# Patient Record
Sex: Female | Born: 2000 | Race: Black or African American | Hispanic: No | Marital: Single | Smoking: Never smoker
Health system: Southern US, Community
[De-identification: ages and names within clinical notes are randomized; demographics above are authoritative.]

## PROBLEM LIST (undated history)

## (undated) DIAGNOSIS — R519 Headache, unspecified: Secondary | ICD-10-CM

## (undated) DIAGNOSIS — R51 Headache: Secondary | ICD-10-CM

## (undated) DIAGNOSIS — Z8619 Personal history of other infectious and parasitic diseases: Secondary | ICD-10-CM

## (undated) HISTORY — DX: Personal history of other infectious and parasitic diseases: Z86.19

---
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
## 2018-01-01 ENCOUNTER — Encounter: Payer: Self-pay | Admitting: Obstetrics and Gynecology

## 2018-01-01 ENCOUNTER — Ambulatory Visit (INDEPENDENT_AMBULATORY_CARE_PROVIDER_SITE_OTHER): Payer: BLUE CROSS/BLUE SHIELD | Admitting: Obstetrics and Gynecology

## 2018-01-01 VITALS — BP 111/68 | HR 64 | Resp 16 | Ht 67.0 in | Wt 131.0 lb

## 2018-01-01 DIAGNOSIS — R102 Pelvic and perineal pain: Secondary | ICD-10-CM

## 2018-01-01 DIAGNOSIS — N912 Amenorrhea, unspecified: Secondary | ICD-10-CM

## 2018-01-01 DIAGNOSIS — Z113 Encounter for screening for infections with a predominantly sexual mode of transmission: Secondary | ICD-10-CM | POA: Diagnosis not present

## 2018-01-01 DIAGNOSIS — Z3201 Encounter for pregnancy test, result positive: Secondary | ICD-10-CM

## 2018-01-01 LAB — POCT URINE PREGNANCY: PREG TEST UR: NEGATIVE

## 2018-01-01 NOTE — Progress Notes (Signed)
GYNECOLOGY OFFICE VISIT NOTE  History:  17 y.o. G0P0000 here today for abdominal pain for about 6 weeks. She reports cramping in lower abdomen last month, left side worse than right but still hurts on both sides. Intermittent pain, does not associate it with anything. Sometimes has poor appetite due to pain. Reports it can be 10/10 but is not that painful all of the time. Has not been to the hospital for this pain. Uses condoms but not 100% of the time. Reports vaginal discharge similar to what she has had in the past but heavier. Reports yeast infection 11/2017 that she self treated that improved.   She does report positive home pregnancy test about 2 weeks ago. Reports she has some nausea, return of migraines. She is very concerned that she is pregnant. Stopped OCPs 06/2017 because it was making her sick.   Past Medical History:  Diagnosis Date  . Frequent headaches    History reviewed. No pertinent surgical history.   Current Outpatient Medications:  .  aspirin-acetaminophen-caffeine (EXCEDRIN MIGRAINE) 250-250-65 MG tablet, Take 1 tablet by mouth every 6 (six) hours as needed for headache (1-2 tabs)., Disp: , Rfl:  .  SUMAtriptan (IMITREX) 25 MG tablet, Take 1 tablet (25 mg total) by mouth every 2 (two) hours as needed for migraine. Maximum 2 times a week, Disp: 10 tablet, Rfl: 1 .  medroxyPROGESTERone (PROVERA) 10 MG tablet, Take 1 tablet (10 mg total) by mouth daily. Use for ten days (Patient not taking: Reported on 01/01/2018), Disp: 10 tablet, Rfl: 2 .  norgestrel-ethinyl estradiol (LO/OVRAL,CRYSELLE) 0.3-30 MG-MCG tablet, Take 1 tablet by mouth daily. (Patient not taking: Reported on 07/12/2017), Disp: 1 Package, Rfl: 11 .  prochlorperazine (COMPAZINE) 10 MG tablet, Take 1 tablet (10 mg total) by mouth every 8 (eight) hours as needed for nausea or vomiting (Nausea or at onset of migraine). (Patient not taking: Reported on 11/29/2016), Disp: 6 tablet, Rfl: 0  The following portions of  the patient's history were reviewed and updated as appropriate: allergies, current medications, past family history, past medical history, past social history, past surgical history and problem list.   Health Maintenance:  Gardasil: has received Flu: has received  Review of Systems:  Pertinent items noted in HPI and remainder of comprehensive ROS otherwise negative.   Objective:  Physical Exam BP 111/68   Pulse 64   Resp 16   Ht 5\' 7"  (1.702 m)   Wt 131 lb (59.4 kg)   LMP 11/13/2017   BMI 20.52 kg/m  CONSTITUTIONAL: Well-developed, well-nourished female in no acute distress.  HENT:  Normocephalic, atraumatic. External right and left ear normal. Oropharynx is clear and moist EYES: Conjunctivae and EOM are normal. Pupils are equal, round, and reactive to light. No scleral icterus.  NECK: Normal range of motion, supple, no masses SKIN: Skin is warm and dry. No rash noted. Not diaphoretic. No erythema. No pallor. NEUROLOGIC: Alert and oriented to person, place, and time. Normal reflexes, muscle tone coordination. No cranial nerve deficit noted. PSYCHIATRIC: Normal mood and affect. Normal behavior. Normal judgment and thought content. CARDIOVASCULAR: Normal heart rate noted RESPIRATORY: Effort normal, no problems with respiration noted ABDOMEN: Soft, no distention noted.  Mild tenderness over suprapubic area and lower quadrants PELVIC: normal appearing external female genitalia with some clear discharge, no uterine tenderness, mild bilateral adenxal tenderness but no fullness or masses palpable MUSCULOSKELETAL: Normal range of motion. No edema noted.  Labs and Imaging No results found.  Assessment & Plan:  1.  Amenorrhea - POCT urine pregnancy - reviewed importance of contraception and use of condoms Declines hormonal contraception for right now  2. Positive pregnancy test -positive at home, neg in office, will repeat HCG - B-HCG Quant  3. Pelvic pain - reviewed concern for  infectious etiology of pain vs resolving pregnancy vs adnexal masses not palpable given BL adnexal pain Reviewed importance of ruling out infectious etiology Will send for [place] STI testing today - Urine cytology ancillary only - Cervicovaginal ancillary only - US Pelvis Complete; Future - US Transvaginal Non-OB; Future - HIV antibody (with reflex) - RPR - Hepatitis B surface antigen - Hepatitis C antibody   Routine preventative health maintenance measures emphasized. Please refer to After Visit Summary for other counseling recommendations.   Return if symptoms worsen or fail to improve.   Baldemar Lenis, M.D. Attending Obstetrician & Gynecologist, [facility] for [employer], [facility]

## 2018-01-02 LAB — RPR: RPR: NONREACTIVE

## 2018-01-02 LAB — HEPATITIS C ANTIBODY
Hepatitis C Ab: NONREACTIVE
SIGNAL TO CUT-OFF: 0.04 (ref <1.00)

## 2018-01-02 LAB — URINE CYTOLOGY ANCILLARY ONLY
CHLAMYDIA, DNA PROBE: NEGATIVE
Neisseria Gonorrhea: NEGATIVE

## 2018-01-02 LAB — HEPATITIS B SURFACE ANTIGEN: Hepatitis B Surface Ag: NONREACTIVE

## 2018-01-02 LAB — HIV ANTIBODY (ROUTINE TESTING W REFLEX): HIV 1&2 Ab, 4th Generation: NONREACTIVE

## 2018-01-02 LAB — HCG, QUANTITATIVE, PREGNANCY: HCG, Total, QN: <2 m[IU]/mL

## 2018-01-02 LAB — CERVICOVAGINAL ANCILLARY ONLY
BACTERIAL VAGINITIS: NEGATIVE
Candida vaginitis: NEGATIVE
Trichomonas: NEGATIVE

## 2018-01-04 ENCOUNTER — Ambulatory Visit (INDEPENDENT_AMBULATORY_CARE_PROVIDER_SITE_OTHER): Payer: BLUE CROSS/BLUE SHIELD

## 2018-01-04 ENCOUNTER — Encounter: Payer: Self-pay | Admitting: *Deleted

## 2018-01-04 DIAGNOSIS — R102 Pelvic and perineal pain: Secondary | ICD-10-CM | POA: Diagnosis not present

## 2018-01-08 ENCOUNTER — Telehealth: Payer: Self-pay

## 2018-01-08 NOTE — Telephone Encounter (Signed)
I spoke with pt and she is aware that her STI testing was negative and that her ultrasound was normal. Pt states that the abdominal pain has gotten worse. I sent a message to Dr.Davis to see what she would like us to do from here and I told pt when I hear back from Dr.Davis I will give her a call.

## 2018-01-17 ENCOUNTER — Ambulatory Visit (HOSPITAL_COMMUNITY)
Admission: RE | Admit: 2018-01-17 | Discharge: 2018-01-17 | Disposition: A | Discharge status: Home / Self Care | Payer: BLUE CROSS/BLUE SHIELD | Source: Ambulatory Visit | Attending: Pediatrics | Admitting: Pediatrics

## 2018-01-17 ENCOUNTER — Other Ambulatory Visit: Payer: Self-pay | Admitting: Pediatrics

## 2018-01-17 DIAGNOSIS — R1084 Generalized abdominal pain: Secondary | ICD-10-CM | POA: Diagnosis not present

## 2018-01-19 ENCOUNTER — Other Ambulatory Visit (HOSPITAL_COMMUNITY): Payer: Self-pay | Admitting: Pediatrics

## 2018-01-19 DIAGNOSIS — R1084 Generalized abdominal pain: Secondary | ICD-10-CM

## 2018-06-13 ENCOUNTER — Other Ambulatory Visit (INDEPENDENT_AMBULATORY_CARE_PROVIDER_SITE_OTHER): Payer: BLUE CROSS/BLUE SHIELD

## 2018-06-13 DIAGNOSIS — Z3202 Encounter for pregnancy test, result negative: Secondary | ICD-10-CM | POA: Diagnosis not present

## 2018-06-13 DIAGNOSIS — Z3200 Encounter for pregnancy test, result unknown: Secondary | ICD-10-CM

## 2018-06-13 DIAGNOSIS — N898 Other specified noninflammatory disorders of vagina: Secondary | ICD-10-CM | POA: Diagnosis not present

## 2018-06-13 LAB — POCT URINE PREGNANCY: Preg Test, Ur: NEGATIVE

## 2018-06-13 NOTE — Addendum Note (Signed)
Addended by: Kathie DikeSOLA, Foy Mungia J on: 06/13/2018 01:26 PM   Modules accepted: Orders

## 2018-06-13 NOTE — Progress Notes (Signed)
PT came in for a pregnancy test. Test was negative. PT also complaining about vaginal irritation and discharge. Aptima sent.

## 2018-06-14 LAB — CERVICOVAGINAL ANCILLARY ONLY
Bacterial vaginitis: NEGATIVE
CANDIDA VAGINITIS: POSITIVE — AB

## 2018-06-15 ENCOUNTER — Other Ambulatory Visit: Payer: Self-pay | Admitting: Obstetrics & Gynecology

## 2018-06-15 MED ORDER — FLUCONAZOLE 150 MG PO TABS: 150.0000 mg | ORAL_TABLET | Freq: Once | ORAL | 3 refills | Status: AC

## 2018-06-15 NOTE — Progress Notes (Signed)
Diflucan prescribed

## 2018-06-18 ENCOUNTER — Telehealth: Payer: Self-pay

## 2018-06-18 DIAGNOSIS — B379 Candidiasis, unspecified: Secondary | ICD-10-CM

## 2018-06-18 MED ORDER — FLUCONAZOLE 150 MG PO TABS: 150.0000 mg | ORAL_TABLET | Freq: Once | ORAL | 0 refills | Status: AC

## 2018-06-18 NOTE — Telephone Encounter (Signed)
Spoke with pt and she is aware of positive yeast infection. Dr.Dove had sent Diflucan in for her but, it was to the wrong pharmacy so I am sending in new Rx to the correct pharmacy. Pt is aware.

## 2018-07-23 DIAGNOSIS — A084 Viral intestinal infection, unspecified: Secondary | ICD-10-CM | POA: Diagnosis not present

## 2018-08-22 ENCOUNTER — Ambulatory Visit (INDEPENDENT_AMBULATORY_CARE_PROVIDER_SITE_OTHER): Payer: BLUE CROSS/BLUE SHIELD | Admitting: Obstetrics & Gynecology

## 2018-08-22 ENCOUNTER — Encounter: Payer: Self-pay | Admitting: Obstetrics & Gynecology

## 2018-08-22 VITALS — BP 83/51 | HR 61 | Resp 16 | Ht 67.0 in | Wt 130.0 lb

## 2018-08-22 DIAGNOSIS — Z3009 Encounter for other general counseling and advice on contraception: Secondary | ICD-10-CM

## 2018-08-22 DIAGNOSIS — Z113 Encounter for screening for infections with a predominantly sexual mode of transmission: Secondary | ICD-10-CM

## 2018-08-22 DIAGNOSIS — N898 Other specified noninflammatory disorders of vagina: Secondary | ICD-10-CM

## 2018-08-23 LAB — CERVICOVAGINAL ANCILLARY ONLY
BACTERIAL VAGINITIS: NEGATIVE
CHLAMYDIA, DNA PROBE: POSITIVE — AB
Candida vaginitis: NEGATIVE
NEISSERIA GONORRHEA: NEGATIVE
Trichomonas: NEGATIVE

## 2018-08-23 LAB — HEPATITIS B SURFACE ANTIGEN: Hepatitis B Surface Ag: NONREACTIVE

## 2018-08-23 LAB — RPR: RPR Ser Ql: NONREACTIVE

## 2018-08-23 LAB — HEPATITIS C ANTIBODY
Hepatitis C Ab: NONREACTIVE
SIGNAL TO CUT-OFF: 0.05 (ref <1.00)

## 2018-08-23 LAB — HIV ANTIBODY (ROUTINE TESTING W REFLEX): HIV 1&2 Ab, 4th Generation: NONREACTIVE

## 2018-08-24 ENCOUNTER — Encounter: Payer: Self-pay | Admitting: Obstetrics & Gynecology

## 2018-08-24 ENCOUNTER — Telehealth: Payer: Self-pay

## 2018-08-24 DIAGNOSIS — A749 Chlamydial infection, unspecified: Secondary | ICD-10-CM | POA: Insufficient documentation

## 2018-08-24 MED ORDER — AZITHROMYCIN 500 MG PO TABS: 1000.0000 mg | ORAL_TABLET | Freq: Once | ORAL | 0 refills | Status: AC

## 2018-08-24 MED ORDER — AZITHROMYCIN 250 MG PO TABS: 250.0000 mg | ORAL_TABLET | Freq: Once | ORAL | 0 refills | Status: DC

## 2018-08-24 NOTE — Telephone Encounter (Signed)
Spoke with pt and let her know of positive Chlamydia results. I explained to pt that her partner also needs to be seen and treated. Pt expressed understanding. Pt is aware she will need to come back for TOC. She has an appt on 10/14 with Dr.Leggett. I explained [facility] but she can speak with doctor about it. Pharmacy verified and Zithromax 1 gram PO 1 dose per protocol book.

## 2018-08-25 NOTE — Progress Notes (Signed)
   Subjective:    Patient ID: Lori Simpson, female    DOB: 05/23/2001, 17 y.o.   MRN: [id number]  HPI  Patient presents c/o very thick discharge at times.  She is not having it today.  Pt brought a picture of her discharge.  It is white and thick.  Pt has one sexual partner.  They may not be exclusive.  Patient also has a lot of questions about the physiology of her reproductive organs, birth control, and STDs.  Review of Systems  Constitutional: Negative.   Respiratory: Negative.   Cardiovascular: Negative.   Gastrointestinal: Negative.   Genitourinary: Positive for vaginal discharge. Negative for pelvic pain.  Psychiatric/Behavioral: Negative.        Objective:   Physical Exam  Constitutional: She is oriented to person, place, and time. She appears well-developed and well-nourished. No distress.  HENT:  Head: Normocephalic and atraumatic.  Eyes: Conjunctivae are normal.  Cardiovascular: Normal rate.  Pulmonary/Chest: Effort normal.  Abdominal: Soft. Bowel sounds are normal. She exhibits no distension and no mass. There is no tenderness. There is no rebound and no guarding.  Genitourinary:  Genitourinary Comments: No white discharge in vault Cervix closed and NT   Musculoskeletal: She exhibits no edema.  Neurological: She is alert and oriented to person, place, and time.  Skin: Skin is warm and dry.  Psychiatric: She has a normal mood and affect.  Vitals reviewed.   Vitals:   08/22/18 1032  BP: (!) 83/51  Pulse: 61  Resp: 16  Weight: 130 lb (59 kg)  Height: 5\' 7"  (1.702 m)      Assessment & Plan:  17 yo female presents with vaginal discharge  1.  Screening for vaginitis and all STDS 2.  Counseling about birth control, family planning, and prevention of STDs .  RTC in 2-3 weeks for 30 more minutes of counseling.    25 minutes face to face with patient with greater than 50% counseling  +Chlamydai--Rx with zithromax and RN to offer expedited partner treatment if  she can verify his allergies.

## 2018-08-27 ENCOUNTER — Telehealth: Payer: Self-pay

## 2018-08-27 NOTE — Telephone Encounter (Signed)
Lori Simpson called to say that she checked with pharmacy and a prescription has not been sent in. Please check on and advise

## 2018-08-27 NOTE — Telephone Encounter (Signed)
Pt called stating that her pharmacy didn't receive the RX for Azithromycin that Dr.Leggett put in. I called pharmacy and fixed it. Pt notified that Rx has been fixed.

## 2018-09-03 ENCOUNTER — Ambulatory Visit: Payer: BLUE CROSS/BLUE SHIELD | Admitting: Obstetrics & Gynecology

## 2018-09-17 ENCOUNTER — Ambulatory Visit (INDEPENDENT_AMBULATORY_CARE_PROVIDER_SITE_OTHER): Payer: BLUE CROSS/BLUE SHIELD | Admitting: Obstetrics & Gynecology

## 2018-09-17 ENCOUNTER — Encounter: Payer: Self-pay | Admitting: Obstetrics & Gynecology

## 2018-09-17 VITALS — BP 108/69 | HR 66 | Ht 67.0 in | Wt 127.0 lb

## 2018-09-17 DIAGNOSIS — Z202 Contact with and (suspected) exposure to infections with a predominantly sexual mode of transmission: Secondary | ICD-10-CM

## 2018-09-17 DIAGNOSIS — Z30011 Encounter for initial prescription of contraceptive pills: Secondary | ICD-10-CM

## 2018-09-17 MED ORDER — NORETHIN ACE-ETH ESTRAD-FE 1-20 MG-MCG(24) PO TABS: 1.0000 | ORAL_TABLET | Freq: Every day | ORAL | 11 refills | Status: DC

## 2018-09-17 NOTE — Progress Notes (Signed)
   Subjective:    Patient ID: Lori Simpson, female    DOB: April 02, 2001, 17 y.o.   MRN: [id number]  HPI Pt presents to ask more questions about anatomy and to discuss birth control.  Pt took the zithromax and no longer has d/c.  Pt told partner.  She has new partner and used condoms.  Pt needs TOC but would prefer for it to occur when she is not on her menses.  Pt interested in OCPs to help her menstrual cycle be more regular and hair growth as well as prevent pregnancy.  Pt also told us that her mother is pregnant (4th child) and comes to our practice.  Review of Systems  Constitutional: Negative.        Hari growth on neck and breasts  Gastrointestinal: Negative.   Genitourinary: Positive for menstrual problem. Negative for vaginal discharge and vaginal pain.  Psychiatric/Behavioral: Negative.        Objective:   Physical Exam  Constitutional: She is oriented to person, place, and time. She appears well-developed and well-nourished. No distress.  HENT:  Head: Normocephalic and atraumatic.  Eyes: Conjunctivae are normal.  Cardiovascular: Normal rate.  Pulmonary/Chest: Effort normal.  Musculoskeletal: She exhibits no edema.  Neurological: She is alert and oriented to person, place, and time.  Skin: Skin is warm and dry.  Mild acne, Hir follicle inflamed under chin from plucking.  Psychiatric: She has a normal mood and affect.  Vitals reviewed.  Vitals:   09/17/18 1308  BP: 108/69  Pulse: 66  Weight: 127 lb (57.6 kg)  Height: 5\' 7"  (1.702 m)    Assessment & Plan:  17 yo female with irregular menstrual cycle and mild hirsutism.  Had a recent positive test for chlamydia and underwent treatment.  1.  On menses and would like to have test of cure next week.  Karie Fetch, RN, will conduct test next week 2.  Would like to start combination OCPs for pregnancy prevention, cycle control and hirsutism. 3.  Discussed reproductive anatomy with diagrams. 4.  Condoms for STD prevention  every time. 5.  Return to see me in 2 months post OCP start.  30 minutes spent face-to-face with patient with greater than 50% counseling.

## 2018-09-24 ENCOUNTER — Ambulatory Visit: Payer: BLUE CROSS/BLUE SHIELD

## 2018-11-19 ENCOUNTER — Ambulatory Visit (INDEPENDENT_AMBULATORY_CARE_PROVIDER_SITE_OTHER): Payer: BLUE CROSS/BLUE SHIELD | Admitting: Obstetrics & Gynecology

## 2018-11-19 ENCOUNTER — Encounter: Payer: Self-pay | Admitting: Obstetrics & Gynecology

## 2018-11-19 VITALS — BP 98/65 | HR 67 | Ht 67.0 in | Wt 136.0 lb

## 2018-11-19 DIAGNOSIS — Z8619 Personal history of other infectious and parasitic diseases: Secondary | ICD-10-CM | POA: Diagnosis not present

## 2018-11-19 DIAGNOSIS — Z789 Other specified health status: Secondary | ICD-10-CM

## 2018-11-19 DIAGNOSIS — Z113 Encounter for screening for infections with a predominantly sexual mode of transmission: Secondary | ICD-10-CM

## 2018-11-19 DIAGNOSIS — Z3009 Encounter for other general counseling and advice on contraception: Secondary | ICD-10-CM | POA: Diagnosis not present

## 2018-11-19 NOTE — Progress Notes (Signed)
   Subjective:    Patient ID: Lori Simpson, female    DOB: 05/23/2001, 17 y.o.   MRN: [id number]  HPI  Pt presents today for f/u of birth control  .  Pt stopped b/c of stomach pain even when taking it at night.  Pt not having sex right now and not interested in birth control.  Pt agrees to use condoms.    Review of Systems  Constitutional: Negative.   Respiratory: Negative.   Cardiovascular: Negative.   Genitourinary: Negative.   Musculoskeletal: Negative.        Objective:   Physical Exam Vitals signs reviewed.  Constitutional:      General: She is not in acute distress.    Appearance: She is well-developed.  HENT:     Head: Normocephalic and atraumatic.  Eyes:     Conjunctiva/sclera: Conjunctivae normal.  Cardiovascular:     Rate and Rhythm: Normal rate.  Pulmonary:     Effort: Pulmonary effort is normal.  Skin:    General: Skin is warm and dry.  Neurological:     Mental Status: She is alert and oriented to person, place, and time.       Assessment & Plan:  17 yo female for TOC and contraceptive counseling.  1.  TOC Chlamydia 2.  After adequate counseling and discussion pt will use condoms only at this time.  Pt knows she can come back to us for further discussion and initiation of another birth control method.

## 2018-11-20 LAB — CERVICOVAGINAL ANCILLARY ONLY
Chlamydia: NEGATIVE
Neisseria Gonorrhea: NEGATIVE

## 2019-01-18 ENCOUNTER — Encounter: Payer: Self-pay | Admitting: Emergency Medicine

## 2019-01-18 ENCOUNTER — Other Ambulatory Visit (HOSPITAL_COMMUNITY)
Admission: RE | Admit: 2019-01-18 | Discharge: 2019-01-18 | Disposition: A | Discharge status: Home / Self Care | Payer: BLUE CROSS/BLUE SHIELD | Source: Ambulatory Visit | Attending: Family Medicine | Admitting: Family Medicine

## 2019-01-18 ENCOUNTER — Other Ambulatory Visit: Payer: Self-pay

## 2019-01-18 ENCOUNTER — Emergency Department (INDEPENDENT_AMBULATORY_CARE_PROVIDER_SITE_OTHER)
Admission: EM | Admit: 2019-01-18 | Discharge: 2019-01-18 | Disposition: A | Discharge status: Home / Self Care | Payer: BLUE CROSS/BLUE SHIELD | Source: Home / Self Care | Attending: Family Medicine | Admitting: Family Medicine

## 2019-01-18 DIAGNOSIS — Z711 Person with feared health complaint in whom no diagnosis is made: Secondary | ICD-10-CM | POA: Diagnosis not present

## 2019-01-18 DIAGNOSIS — N898 Other specified noninflammatory disorders of vagina: Secondary | ICD-10-CM

## 2019-01-18 LAB — POCT URINALYSIS DIP (MANUAL ENTRY)
BILIRUBIN UA: NEGATIVE mg/dL
Bilirubin, UA: NEGATIVE
Blood, UA: NEGATIVE
Glucose, UA: NEGATIVE mg/dL
Nitrite, UA: NEGATIVE
Protein Ur, POC: NEGATIVE mg/dL
Spec Grav, UA: <=1.005 — AB (ref 1.010–1.025)
Urobilinogen, UA: 0.2 E.U./dL
pH, UA: 6 (ref 5.0–8.0)

## 2019-01-18 LAB — POCT URINE PREGNANCY: Preg Test, Ur: NEGATIVE

## 2019-01-18 NOTE — Discharge Instructions (Addendum)
Avoid sexual contact until after results of tests are available, and treatment started if necessary.

## 2019-01-18 NOTE — ED Provider Notes (Signed)
[facility]    CSN: [id number] Arrival date & time: 01/18/19  1437     History   Chief Complaint Chief Complaint  Patient presents with  . Vaginal Discharge    HPI Lori Simpson is a 18 y.o. female.   Patient complains of a malodorous vaginal discharge for one week.  She states that she had had intercourse several days ago with a tampon in place that came out two days later.  She denies pelvic or abdominal pain.  No fevers, chills, and sweats.  No rash.  She feels well otherwise.  Patient's last menstrual period was 12/29/2018 (exact date).   The history is provided by the patient.  Vaginal Discharge  Quality:  Malodorous and watery Severity:  Mild Onset quality:  Gradual Duration:  1 week Timing:  Constant Progression:  Unchanged Chronicity:  New Context: after intercourse   Relieved by:  None tried Worsened by:  Nothing Ineffective treatments:  None tried Associated symptoms: vaginal itching   Associated symptoms: no abdominal pain, no dysuria, no fever, no genital lesions, no nausea, no rash, no urinary frequency, no urinary hesitancy, no urinary incontinence and no vomiting     Past Medical History:  Diagnosis Date  . Frequent headaches     Patient Active Problem List   Diagnosis Date Noted  . Chlamydia 08/24/2018  . Tension headache 01/21/2016  . Migraine without aura and without status migrainosus, not intractable 01/21/2016  . Anxiety state 01/21/2016  . Depressed mood 01/21/2016  . 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 No pertinent surgical history.  OB History    Gravida  0   Para  0   Term  0   Preterm  0   AB  0   Living  0     SAB  0   TAB  0   Ectopic  0   Multiple  0   Live Births  0            Home Medications    Prior to Admission medications   Not on File    Family History Family History  Problem Relation Age of Onset  . Anxiety disorder Mother   . ADD / ADHD Mother     . ADD / ADHD Brother   . Migraines Paternal Aunt   . Bipolar disorder Maternal Grandmother   . Depression Maternal Grandmother   . Migraines Paternal Grandfather   . Epilepsy Other        [facility]    Social History Social History   Tobacco Use  . Smoking status: Current Every Day Smoker    Types: Cigarettes  . Smokeless tobacco: Never Used  Substance Use Topics  . Alcohol use: Yes    Alcohol/week: 0.0 standard drinks  . Drug use: Yes    Types: Marijuana     Allergies   Other   Review of Systems Review of Systems  Constitutional: Negative for fever.  Gastrointestinal: Negative for abdominal pain, nausea and vomiting.  Genitourinary: Positive for vaginal discharge. Negative for bladder incontinence, dysuria and hesitancy.  All other systems reviewed and are negative.    Physical Exam Triage Vital Signs ED Triage Vitals [01/18/19 1514]  Enc Vitals Group     BP 108/72     Pulse Rate 66     Resp      Temp 97.9 F (36.6 C)     Temp Source Oral     SpO2 98 %  Weight 150 lb (68 kg)     Height 5\' 6"  (1.676 m)     Head Circumference      Peak Flow      Pain Score 0     Pain Loc      Pain Edu?      Excl. in GC?    No data found.  Updated Vital Signs BP 108/72 (BP Location: Right Arm)   Pulse 66   Temp 97.9 F (36.6 C) (Oral)   Ht 5\' 6"  (1.676 m)   Wt 68 kg   LMP 12/29/2018 (Exact Date)   SpO2 98%   BMI 24.21 kg/m   Visual Acuity Right Eye Distance:   Left Eye Distance:   Bilateral Distance:    Right Eye Near:   Left Eye Near:    Bilateral Near:     Physical Exam Nursing notes and Vital Signs reviewed. Appearance:  Patient appears stated age, and in no acute distress.    Eyes:  Pupils are equal, round, and reactive to light and accomodation.  Extraocular movement is intact.  Conjunctivae are not inflamed   Pharynx:  Normal; moist mucous membranes  Neck:  Supple.  No adenopathy Lungs:  Clear to auscultation.  Breath sounds are equal.  Moving  air well. Heart:  Regular rate and rhythm without murmurs, rubs, or gallops.  Abdomen:  Nontender without masses or hepatosplenomegaly.  Bowel sounds are present.  No CVA or flank tenderness.  Extremities:  No edema.  Skin:  No rash present.    Pelvic exam deferred; patient instructed in self-obtaining a vaginal specimen.  UC Treatments / Results  Labs (all labs ordered are listed, but only abnormal results are displayed) Labs Reviewed  HIV ANTIBODY (ROUTINE TESTING W REFLEX)  RPR  CERVICOVAGINAL ANCILLARY ONLY  POCT urinalysis negative POCT pregnancy test negative  EKG None  Radiology No results found.  Procedures Procedures (including critical care time)  Medications Ordered in UC Medications - No data to display  Initial Impression / Assessment and Plan / UC Course  I have reviewed the triage vital signs and the nursing notes.  Pertinent labs & imaging results that were available during my care of the patient were reviewed by me and considered in my medical decision making (see chart for details).    GC/chlamydia; KOH/wet prep pending.  HIV and RPR pending.   Final Clinical Impressions(s) / UC Diagnoses   Final diagnoses:  Vaginal discharge  Concern about STD in female without diagnosis     Discharge Instructions     Avoid sexual contact until after results of tests are available, and treatment started if necessary.    ED Prescriptions    None        Lattie Haw, MD 01/21/19 2242

## 2019-01-18 NOTE — ED Triage Notes (Signed)
Vaginal discharge, foul odor x 1 week. Patient had a tampon in her vagina and had sex. The tampon came out in the toilet a couple of days afterwards.

## 2019-01-20 ENCOUNTER — Telehealth: Payer: Self-pay | Admitting: Emergency Medicine

## 2019-01-20 NOTE — Telephone Encounter (Signed)
Spoke with patient and told her rx sent in for UTI.

## 2019-01-20 NOTE — Telephone Encounter (Signed)
Called wanting all lab results; only HIV neg available; told her we would call when the rest are available.

## 2019-01-21 LAB — RPR: RPR Ser Ql: NONREACTIVE

## 2019-01-21 LAB — URINE CULTURE
MICRO NUMBER:: 197662
SPECIMEN QUALITY:: ADEQUATE

## 2019-01-21 LAB — HIV ANTIBODY (ROUTINE TESTING W REFLEX): HIV: NONREACTIVE

## 2019-01-21 LAB — CERVICOVAGINAL ANCILLARY ONLY
BACTERIAL VAGINITIS: POSITIVE — AB
Candida vaginitis: NEGATIVE
Chlamydia: NEGATIVE
Neisseria Gonorrhea: NEGATIVE
TRICH (WINDOWPATH): NEGATIVE

## 2019-01-21 MED ORDER — METRONIDAZOLE 500 MG PO TABS: 500.0000 mg | ORAL_TABLET | Freq: Two times a day (BID) | ORAL | 0 refills | Status: DC

## 2019-01-21 MED ORDER — AMOXICILLIN 875 MG PO TABS: 875.0000 mg | ORAL_TABLET | Freq: Two times a day (BID) | ORAL | 0 refills | Status: DC

## 2019-01-21 NOTE — Telephone Encounter (Signed)
Pt states that pharmacy does not have the medication that was sent.  I resent Amoxicillin 875mg  po 1 bidx 5 days, and Flagyl 500mg  1 BIDx 7 days to [facility].  Notified patient that it was sent to pharmacy.  Called pharmacy to confirm that medication was received.

## 2019-02-11 DIAGNOSIS — Z30017 Encounter for initial prescription of implantable subdermal contraceptive: Secondary | ICD-10-CM | POA: Diagnosis not present

## 2019-02-11 DIAGNOSIS — Z3202 Encounter for pregnancy test, result negative: Secondary | ICD-10-CM | POA: Diagnosis not present

## 2019-02-18 ENCOUNTER — Telehealth: Payer: Self-pay | Admitting: *Deleted

## 2019-02-18 MED ORDER — FLUCONAZOLE 150 MG PO TABS: ORAL_TABLET | ORAL | 0 refills | Status: DC

## 2019-02-18 NOTE — Telephone Encounter (Signed)
Pt called stating that she had been seen in UC over the weekend for BV.  She stated she is now having vaginal itching and is requesting to be seen.  I explained since this is not an emergency we would just send in a RX for Diflucan to keep her out of the office due to Coronovirus.  If not better after the med then she may need to be seen.  She agrees.

## 2019-05-30 DIAGNOSIS — Z3046 Encounter for surveillance of implantable subdermal contraceptive: Secondary | ICD-10-CM | POA: Diagnosis not present

## 2019-07-12 ENCOUNTER — Ambulatory Visit (INDEPENDENT_AMBULATORY_CARE_PROVIDER_SITE_OTHER): Payer: BC Managed Care – PPO

## 2019-07-12 ENCOUNTER — Other Ambulatory Visit: Payer: Self-pay

## 2019-07-12 DIAGNOSIS — B9689 Other specified bacterial agents as the cause of diseases classified elsewhere: Secondary | ICD-10-CM | POA: Diagnosis not present

## 2019-07-12 DIAGNOSIS — N898 Other specified noninflammatory disorders of vagina: Secondary | ICD-10-CM

## 2019-07-12 DIAGNOSIS — N760 Acute vaginitis: Secondary | ICD-10-CM | POA: Diagnosis not present

## 2019-07-12 DIAGNOSIS — B373 Candidiasis of vulva and vagina: Secondary | ICD-10-CM

## 2019-07-12 DIAGNOSIS — Z3202 Encounter for pregnancy test, result negative: Secondary | ICD-10-CM

## 2019-07-12 DIAGNOSIS — Z113 Encounter for screening for infections with a predominantly sexual mode of transmission: Secondary | ICD-10-CM

## 2019-07-12 DIAGNOSIS — Z3200 Encounter for pregnancy test, result unknown: Secondary | ICD-10-CM

## 2019-07-12 LAB — POCT URINE PREGNANCY: Preg Test, Ur: NEGATIVE

## 2019-07-12 NOTE — Progress Notes (Signed)
Pt c/o vaginal irritation and discharge. Aptima sent. Pt denies UTI symptoms. PT is aware we will treat based off results. Pt requests UPT. UPT negative.

## 2019-07-18 LAB — CERVICOVAGINAL ANCILLARY ONLY
Bacterial vaginitis: POSITIVE — AB
Candida vaginitis: POSITIVE — AB
Chlamydia: NEGATIVE
Neisseria Gonorrhea: NEGATIVE
Trichomonas: NEGATIVE

## 2019-07-20 MED ORDER — METRONIDAZOLE 500 MG PO TABS: 500.0000 mg | ORAL_TABLET | Freq: Two times a day (BID) | ORAL | 0 refills | Status: DC

## 2019-07-20 MED ORDER — FLUCONAZOLE 150 MG PO TABS: 150.0000 mg | ORAL_TABLET | Freq: Once | ORAL | 1 refills | Status: AC

## 2019-07-20 NOTE — Addendum Note (Signed)
Addended by: Wende Mott on: 07/20/2019 10:07 AM   Modules accepted: Orders

## 2019-09-24 ENCOUNTER — Ambulatory Visit (INDEPENDENT_AMBULATORY_CARE_PROVIDER_SITE_OTHER): Payer: BC Managed Care – PPO | Admitting: Advanced Practice Midwife

## 2019-09-24 ENCOUNTER — Encounter: Payer: Self-pay | Admitting: Advanced Practice Midwife

## 2019-09-24 ENCOUNTER — Other Ambulatory Visit: Payer: Self-pay

## 2019-09-24 VITALS — BP 104/68 | HR 69 | Wt 134.0 lb

## 2019-09-24 DIAGNOSIS — B9689 Other specified bacterial agents as the cause of diseases classified elsewhere: Secondary | ICD-10-CM

## 2019-09-24 DIAGNOSIS — N898 Other specified noninflammatory disorders of vagina: Secondary | ICD-10-CM

## 2019-09-24 DIAGNOSIS — N760 Acute vaginitis: Secondary | ICD-10-CM | POA: Diagnosis not present

## 2019-09-24 DIAGNOSIS — Z113 Encounter for screening for infections with a predominantly sexual mode of transmission: Secondary | ICD-10-CM

## 2019-09-24 MED ORDER — METRONIDAZOLE 0.75 % VA GEL: 1.0000 | Freq: Every day | VAGINAL | 2 refills | Status: DC

## 2019-09-24 NOTE — Progress Notes (Signed)
  GYNECOLOGY PROGRESS NOTE  History:  18 y.o. G0P0000 presents to [facility] office today for problem gyn visit. She reports vaginal discharge with odor similar to previous BV symptoms. The symptoms are worse during menses.  She reports washing frequently, using 2 different soaps in the vaginal area.   She recently completed a course of Flagyl but symptoms returned as soon as she finished the medication.    The following portions of the patient's history were reviewed and updated as appropriate: allergies, current medications, past family history, past medical history, past social history, past surgical history and problem list.   Review of Systems:  Pertinent items are noted in HPI.   Objective:  Physical Exam Blood pressure 104/68, pulse 69, weight 134 lb (60.8 kg), last menstrual period 09/22/2019. VS reviewed, nursing note reviewed,  Constitutional: well developed, well nourished, no distress HEENT: normocephalic CV: normal rate Pulm/chest wall: normal effort Breast Exam: deferred Abdomen: soft Neuro: alert and oriented x 3 Skin: warm, dry Psych: affect normal Pelvic exam: Cervix pink, visually closed, without lesion, scant white creamy discharge, vaginal walls and external genitalia normal Bimanual exam: Cervix 0/long/high, firm, anterior, neg CMT, uterus nontender, nonenlarged, adnexa without tenderness, enlargement, or mass  Assessment & Plan:  1. Vaginal discharge  - Cervicovaginal ancillary only( [place])  2. Routine screening for STI (sexually transmitted infection)  - HIV antibody (with reflex) - RPR - Hepatitis C Antibody - Hepatitis B Surface AntiGEN - Cervicovaginal ancillary only( [place])  3. Bacterial vaginosis --symptoms c/w BV, STI testing pending --Written and verbal teaching about BV prevention/vaginal health including decreased use of soaps/washing, use of oral probiotic, boric acid suppositories and decreased use of pantyliners. Pt states  understanding. --F/U in 3 months    Fatima Blank, CNM 1:30 PM

## 2019-09-24 NOTE — Patient Instructions (Signed)

## 2019-09-25 LAB — HEPATITIS B SURFACE ANTIGEN: Hepatitis B Surface Ag: NONREACTIVE

## 2019-09-25 LAB — HIV ANTIBODY (ROUTINE TESTING W REFLEX): HIV 1&2 Ab, 4th Generation: NONREACTIVE

## 2019-09-25 LAB — HEPATITIS C ANTIBODY
Hepatitis C Ab: NONREACTIVE
SIGNAL TO CUT-OFF: 0.05 (ref <1.00)

## 2019-09-25 LAB — RPR: RPR Ser Ql: NONREACTIVE

## 2019-10-02 LAB — CERVICOVAGINAL ANCILLARY ONLY
Bacterial Vaginitis (gardnerella): POSITIVE — AB
Candida Glabrata: NEGATIVE
Candida Vaginitis: NEGATIVE
Chlamydia: NEGATIVE
Comment: NEGATIVE
Comment: NEGATIVE
Comment: NEGATIVE
Comment: NEGATIVE
Comment: NEGATIVE
Comment: NORMAL
Neisseria Gonorrhea: NEGATIVE
Trichomonas: NEGATIVE

## 2019-11-12 ENCOUNTER — Ambulatory Visit (INDEPENDENT_AMBULATORY_CARE_PROVIDER_SITE_OTHER): Payer: BC Managed Care – PPO | Admitting: Certified Nurse Midwife

## 2019-11-12 ENCOUNTER — Other Ambulatory Visit: Payer: Self-pay

## 2019-11-12 ENCOUNTER — Encounter: Payer: Self-pay | Admitting: Certified Nurse Midwife

## 2019-11-12 VITALS — BP 105/80 | HR 97 | Ht 67.0 in | Wt 133.0 lb

## 2019-11-12 DIAGNOSIS — Z202 Contact with and (suspected) exposure to infections with a predominantly sexual mode of transmission: Secondary | ICD-10-CM

## 2019-11-12 DIAGNOSIS — Z113 Encounter for screening for infections with a predominantly sexual mode of transmission: Secondary | ICD-10-CM

## 2019-11-12 MED ORDER — AZITHROMYCIN 500 MG PO TABS: 1000.0000 mg | ORAL_TABLET | Freq: Once | ORAL | 0 refills | Status: AC

## 2019-11-12 NOTE — Progress Notes (Signed)
Pt's partner recently let her know that he has Chlamydia. Pt declines blood work- only wants to be tested for Chlamydia.

## 2019-11-12 NOTE — Progress Notes (Signed)
History:  Ms. Lori Simpson is a 18 y.o. G0P0000 who presents to clinic today for STD screening. Pt reports her partner told her that he tested positive for Chlamydia. Pt denies abdominal/pelvic pain, vaginal discharge, odor, or abnormal vaginal bleeding. Requests only testing for chlamydia. Declines blood work as she just had that done earlier this year.   The following portions of the patient's history were reviewed and updated as appropriate: allergies, current medications, family history, past medical history, social history, past surgical history and problem list.  Review of Systems:  Review of Systems  Constitutional: Negative.   HENT: Negative.   Eyes: Negative.   Respiratory: Negative.   Cardiovascular: Negative.   Gastrointestinal: Negative.   Genitourinary: Negative.   Musculoskeletal: Negative.   Skin: Negative.   Neurological: Negative.   Endo/Heme/Allergies: Negative.   Psychiatric/Behavioral: Negative.   All other systems reviewed and are negative.    Objective:  Physical Exam BP 105/80   Pulse 97   Ht 5\' 7"  (1.702 m)   Wt 60.3 kg   BMI 20.83 kg/m   LMP: 11/07/2019 Physical Exam Vitals signs and nursing note reviewed. Exam conducted with a chaperone present.  Constitutional:      Appearance: Normal appearance. She is normal weight.  HENT:     Head: Normocephalic and atraumatic.     Nose: Nose normal.     Mouth/Throat:     Mouth: Mucous membranes are moist.     Pharynx: Oropharynx is clear.  Eyes:     Extraocular Movements: Extraocular movements intact.     Conjunctiva/sclera: Conjunctivae normal.     Pupils: Pupils are equal, round, and reactive to light.  Neck:     Musculoskeletal: Normal range of motion and neck supple.  Cardiovascular:     Rate and Rhythm: Normal rate and regular rhythm.     Pulses: Normal pulses.  Pulmonary:     Effort: Pulmonary effort is normal.  Abdominal:     General: Abdomen is flat.     Palpations: Abdomen is soft.   Genitourinary:    General: Normal vulva.  Musculoskeletal: Normal range of motion.  Skin:    General: Skin is warm and dry.  Neurological:     General: No focal deficit present.     Mental Status: She is alert and oriented to person, place, and time.  Psychiatric:        Mood and Affect: Mood normal.        Behavior: Behavior normal.        Thought Content: Thought content normal.        Judgment: Judgment normal.    Labs and Imaging No results found for this or any previous visit (from the past 24 hour(s)).   Assessment & Plan:  1. Exposure to chlamydia - Pt here for STD screening after partner tested positive for chlamydia - Swabs collected for GC/CT; will treat for chlamydia today - Azithromycin sent to pharmacy  - Cervicovaginal ancillary only( [place]) - azithromycin (ZITHROMAX) 500 MG tablet; Take 2 tablets (1,000 mg total) by mouth once for 1 dose.  Dispense: 2 tablet; Refill: 0  Follow up as needed  Maryagnes Amos, SNM 11/12/2019 1:42 PM

## 2019-11-13 LAB — CERVICOVAGINAL ANCILLARY ONLY
Chlamydia: NEGATIVE
Comment: NEGATIVE
Comment: NORMAL
Neisseria Gonorrhea: NEGATIVE

## 2019-11-22 ENCOUNTER — Other Ambulatory Visit: Payer: Self-pay

## 2019-11-22 DIAGNOSIS — Z20822 Contact with and (suspected) exposure to covid-19: Secondary | ICD-10-CM

## 2019-11-25 ENCOUNTER — Telehealth: Payer: Self-pay

## 2019-11-25 NOTE — Telephone Encounter (Signed)
PT called to get test results from 12/18 done at [facility]. Test and results are not shown in pt profile.  Informed her that she would need to call the testing site get those results and I would reach out to a nurse for support.   Pt will call testing site. She voiced understanding and denied any questions.   [place]

## 2019-12-03 ENCOUNTER — Encounter: Payer: Self-pay | Admitting: Advanced Practice Midwife

## 2019-12-03 ENCOUNTER — Other Ambulatory Visit: Payer: Self-pay

## 2019-12-03 ENCOUNTER — Ambulatory Visit (INDEPENDENT_AMBULATORY_CARE_PROVIDER_SITE_OTHER): Payer: BC Managed Care – PPO | Admitting: Advanced Practice Midwife

## 2019-12-03 VITALS — Wt 143.0 lb

## 2019-12-03 DIAGNOSIS — N898 Other specified noninflammatory disorders of vagina: Secondary | ICD-10-CM | POA: Diagnosis not present

## 2019-12-03 DIAGNOSIS — Z7251 High risk heterosexual behavior: Secondary | ICD-10-CM | POA: Insufficient documentation

## 2019-12-03 DIAGNOSIS — Z3202 Encounter for pregnancy test, result negative: Secondary | ICD-10-CM | POA: Diagnosis not present

## 2019-12-03 DIAGNOSIS — B9689 Other specified bacterial agents as the cause of diseases classified elsewhere: Secondary | ICD-10-CM | POA: Diagnosis not present

## 2019-12-03 DIAGNOSIS — N760 Acute vaginitis: Secondary | ICD-10-CM

## 2019-12-03 DIAGNOSIS — Z113 Encounter for screening for infections with a predominantly sexual mode of transmission: Secondary | ICD-10-CM | POA: Diagnosis not present

## 2019-12-03 DIAGNOSIS — Z3009 Encounter for other general counseling and advice on contraception: Secondary | ICD-10-CM | POA: Diagnosis not present

## 2019-12-03 DIAGNOSIS — N926 Irregular menstruation, unspecified: Secondary | ICD-10-CM | POA: Diagnosis not present

## 2019-12-03 LAB — POCT URINE PREGNANCY: Preg Test, Ur: NEGATIVE

## 2019-12-03 NOTE — Progress Notes (Signed)
  GYNECOLOGY PROGRESS NOTE  History:  18 y.o. G0P0000 presents to [facility] office today for problem gyn visit. She reports vaginal discharge and odor for 2-3 days. Discharge is thick with a fishy odor. She reports some lower abdominal cramping and nipple soreness. She was concerned that she had 2 back to back periods at the end of November and beginning of December. Concerned with she may be pregnant. Has not taken a pregnancy test at home. Was treated for presumptively for chlamydia at the beginning of the month. Has not had intercourse with that partner, but has a new partner. She is wanting STD screening today. She denies h/a, dizziness, shortness of breath, n/v, or fever/chills.    The following portions of the patient's history were reviewed and updated as appropriate: allergies, current medications, past family history, past medical history, past social history, past surgical history and problem list. Last pap smear: has not had one due to age.  Review of Systems:  Pertinent items are noted in HPI.   Objective:  Physical Exam Weight 143 lb (64.9 kg), last menstrual period 11/07/2019. VS reviewed, nursing note reviewed,  Constitutional: well developed, well nourished, no distress HEENT: normocephalic CV: normal rate Pulm/chest wall: normal effort Breast Exam: deferred Abdomen: soft Neuro: alert and oriented x 3 Skin: warm, dry Psych: affect normal Pelvic exam: external genitalia normal; pelvic deferred; blind swabs obtained Bimanual exam: deferred  Assessment & Plan:  1. Vaginal discharge - Pt here with complaints of discharge with odor for 2-3 days - Pt had intercourse with new partner 9 days ago. Discussed with pt that GC/CT may not show up today, but may show up at a later time if positive.  - Given symptoms, will collect blind swabs today. Will do another blind swab at IUD placement - Pt declines STD blood testing today. Wants when she comes back to have IUD placed. Will draw  then.    - Cervicovaginal ancillary only( [place])  2. Irregular bleeding - No bleeding today - UPT negative today  - POCT urine pregnancy  3. General counseling and advice for contraceptive management - Discussed with patient various methods of contraception - Pt would like to schedule appointment to have IUD placed - Pamphlets given on Mirena, Mandan, and Buddy Duty, SNM   I confirm that I have verified the information documented in the nurse midwife student's note and that I have also personally reperformed the history, physical exam and all medical decision making activities of this service and have verified that all service and findings are accurately documented in this student's note.    Elvera Maria, CNM 12/03/2019 12:04 PM

## 2019-12-03 NOTE — Patient Instructions (Signed)

## 2019-12-04 LAB — CERVICOVAGINAL ANCILLARY ONLY
Bacterial Vaginitis (gardnerella): POSITIVE — AB
Candida Glabrata: NEGATIVE
Candida Vaginitis: NEGATIVE
Chlamydia: NEGATIVE
Comment: NEGATIVE
Comment: NEGATIVE
Comment: NEGATIVE
Comment: NEGATIVE
Comment: NEGATIVE
Comment: NORMAL
Neisseria Gonorrhea: NEGATIVE
Trichomonas: NEGATIVE

## 2019-12-09 ENCOUNTER — Telehealth: Payer: Self-pay | Admitting: *Deleted

## 2019-12-09 MED ORDER — METRONIDAZOLE 500 MG PO TABS: 500.0000 mg | ORAL_TABLET | Freq: Two times a day (BID) | ORAL | 0 refills | Status: DC

## 2019-12-09 NOTE — Telephone Encounter (Signed)
Pt notified of positive BV and RX for Falgyl sent to [facility] per protocol.

## 2019-12-16 ENCOUNTER — Ambulatory Visit (INDEPENDENT_AMBULATORY_CARE_PROVIDER_SITE_OTHER): Payer: BC Managed Care – PPO | Admitting: Obstetrics & Gynecology

## 2019-12-16 ENCOUNTER — Other Ambulatory Visit: Payer: Self-pay

## 2019-12-16 ENCOUNTER — Encounter: Payer: Self-pay | Admitting: Obstetrics & Gynecology

## 2019-12-16 VITALS — BP 95/60 | HR 60 | Temp 98.3°F | Resp 16 | Ht 67.0 in | Wt 134.0 lb

## 2019-12-16 DIAGNOSIS — B9689 Other specified bacterial agents as the cause of diseases classified elsewhere: Secondary | ICD-10-CM | POA: Diagnosis not present

## 2019-12-16 DIAGNOSIS — N898 Other specified noninflammatory disorders of vagina: Secondary | ICD-10-CM | POA: Diagnosis not present

## 2019-12-16 DIAGNOSIS — Z30430 Encounter for insertion of intrauterine contraceptive device: Secondary | ICD-10-CM

## 2019-12-16 DIAGNOSIS — Z3202 Encounter for pregnancy test, result negative: Secondary | ICD-10-CM | POA: Diagnosis not present

## 2019-12-16 DIAGNOSIS — N760 Acute vaginitis: Secondary | ICD-10-CM

## 2019-12-16 DIAGNOSIS — Z113 Encounter for screening for infections with a predominantly sexual mode of transmission: Secondary | ICD-10-CM

## 2019-12-16 LAB — POCT URINE PREGNANCY: Preg Test, Ur: NEGATIVE

## 2019-12-16 MED ORDER — LEVONORGESTREL 19.5 MG IU IUD: INTRAUTERINE_SYSTEM | Freq: Once | INTRAUTERINE | Status: AC

## 2019-12-16 NOTE — Progress Notes (Signed)
   Subjective:    Patient ID: Lori Simpson, female    DOB: Dec 03, 2001, 18 y.o.   MRN: [id number]  HPI  Pt present for IUD insertion.  Pt has not had intercourse.  She also agrees to STD testing.  UPT negative.    Review of Systems  Constitutional: Negative.   Respiratory: Negative.   Cardiovascular: Negative.   Genitourinary: Negative.   Psychiatric/Behavioral: Negative.        Objective:   Physical Exam Vitals reviewed.  Constitutional:      General: She is not in acute distress.    Appearance: She is well-developed.  HENT:     Head: Normocephalic and atraumatic.  Eyes:     Conjunctiva/sclera: Conjunctivae normal.  Cardiovascular:     Rate and Rhythm: Normal rate.  Pulmonary:     Effort: Pulmonary effort is normal.  Abdominal:     General: Abdomen is flat.     Palpations: Abdomen is soft.  Genitourinary:    Comments: Anteverted, non tender No masses Cervix no lesion Skin:    General: Skin is warm and dry.  Neurological:     Mental Status: She is alert and oriented to person, place, and time.    Vitals:   12/16/19 1004  BP: 95/60  Pulse: 60  Resp: 16  Temp: 98.3 F (36.8 C)  Weight: 134 lb (60.8 kg)  Height: 5\' 7"  (1.702 m)       Assessment & Plan:  19 yo female for IUD insertion.  Decided on [place].  STD testing  IUD Procedure Note Patient identified, informed consent performed.  Discussed risks of irregular bleeding, cramping, infection, malpositioning or misplacement of the IUD outside the uterus which may require further procedures. Time out was performed.  Urine pregnancy test negative.  Speculum placed in the vagina.  Cervix visualized.  Cleaned with Betadine x 2.  Grasped anteriorly with a single tooth tenaculum.  Uterus sounded to 8 cm.  Kylena IUD placed per manufacturer's recommendations.  Strings trimmed to 3 cm. Tenaculum was removed, good hemostasis noted.  Patient tolerated procedure well.   Patient was given post-procedure  instructions and the [facility] care card with expiration date.  Patient was also asked to check IUD strings periodically and follow up in 4-6 weeks for IUD check.

## 2019-12-17 ENCOUNTER — Other Ambulatory Visit: Payer: Self-pay

## 2019-12-17 LAB — CERVICOVAGINAL ANCILLARY ONLY
Bacterial Vaginitis (gardnerella): POSITIVE — AB
Candida Glabrata: NEGATIVE
Candida Vaginitis: NEGATIVE
Chlamydia: NEGATIVE
Comment: NEGATIVE
Comment: NEGATIVE
Comment: NEGATIVE
Comment: NEGATIVE
Comment: NEGATIVE
Comment: NORMAL
Neisseria Gonorrhea: NEGATIVE
Trichomonas: NEGATIVE

## 2019-12-17 LAB — HEPATITIS C ANTIBODY
Hepatitis C Ab: NONREACTIVE
SIGNAL TO CUT-OFF: 0.05 (ref <1.00)

## 2019-12-17 LAB — HEPATITIS B SURFACE ANTIGEN: Hepatitis B Surface Ag: NONREACTIVE

## 2019-12-17 LAB — RPR: RPR Ser Ql: NONREACTIVE

## 2019-12-17 LAB — HIV ANTIBODY (ROUTINE TESTING W REFLEX): HIV 1&2 Ab, 4th Generation: NONREACTIVE

## 2019-12-19 ENCOUNTER — Other Ambulatory Visit: Payer: Self-pay

## 2019-12-19 DIAGNOSIS — N760 Acute vaginitis: Secondary | ICD-10-CM

## 2019-12-19 DIAGNOSIS — B9689 Other specified bacterial agents as the cause of diseases classified elsewhere: Secondary | ICD-10-CM

## 2019-12-19 MED ORDER — METRONIDAZOLE 0.75 % VA GEL: 1.0000 | Freq: Two times a day (BID) | VAGINAL | 0 refills | Status: DC

## 2019-12-19 NOTE — Progress Notes (Signed)
Metrogel sent per Dr Penne Lash

## 2019-12-22 IMAGING — US US TRANSVAGINAL NON-OB
1 series · 14 of 25 positions shown · non-contrast
Comparison: None

CLINICAL DATA: Pelvic pain for 6 weeks with increase right lower
quadrant pain since last night.

EXAM:
TRANSABDOMINAL AND TRANSVAGINAL ULTRASOUND OF PELVIS
TECHNIQUE: Both transabdominal and transvaginal ultrasound examinations of the
pelvis were performed. Transabdominal technique was performed for
global imaging of the pelvis including uterus, ovaries, adnexal
regions, and pelvic cul-de-sac. It was necessary to proceed with
endovaginal exam following the transabdominal exam to visualize the
uterus, ovaries, and adnexa.

[Series 1: us transvaginal non-ob · 0.16mm/px · 14 of 93 slices shown]
[im 1/93]
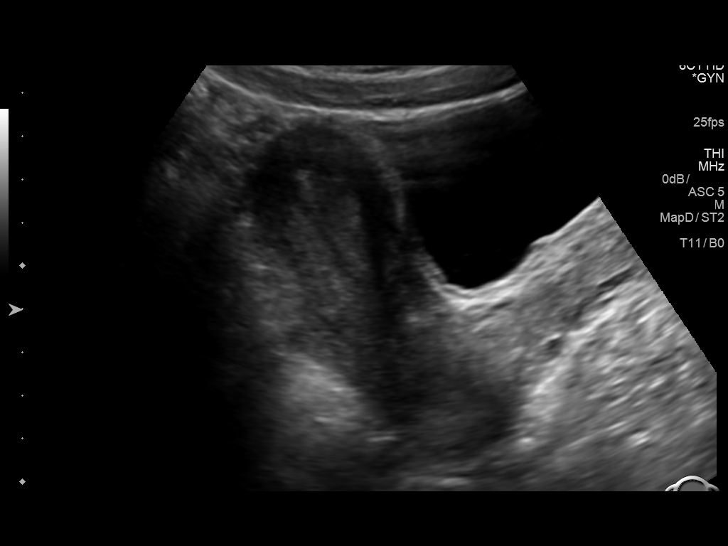
[im 8/93]
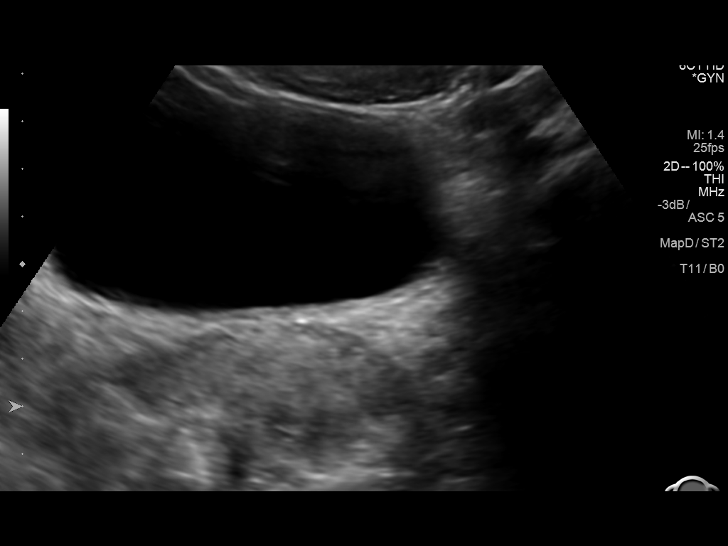
[im 16/93]
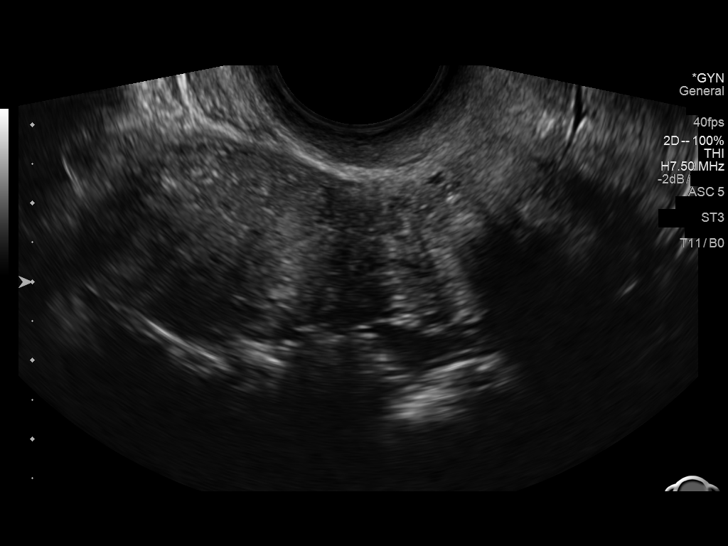
[im 24/93]
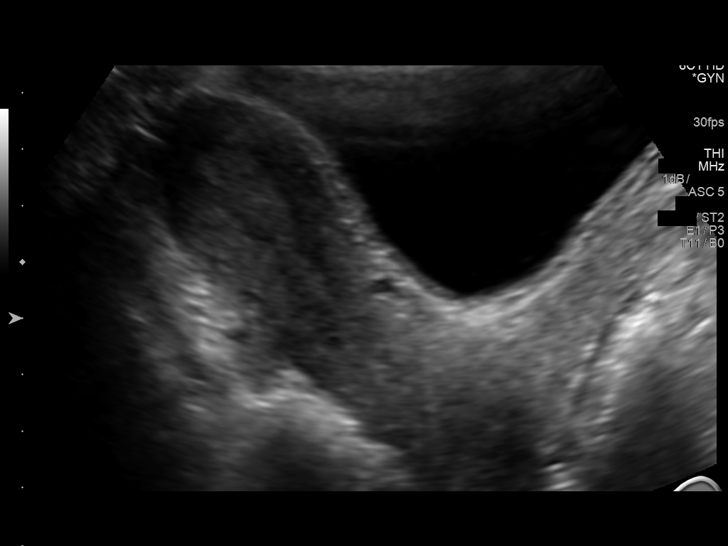
[im 31/93]
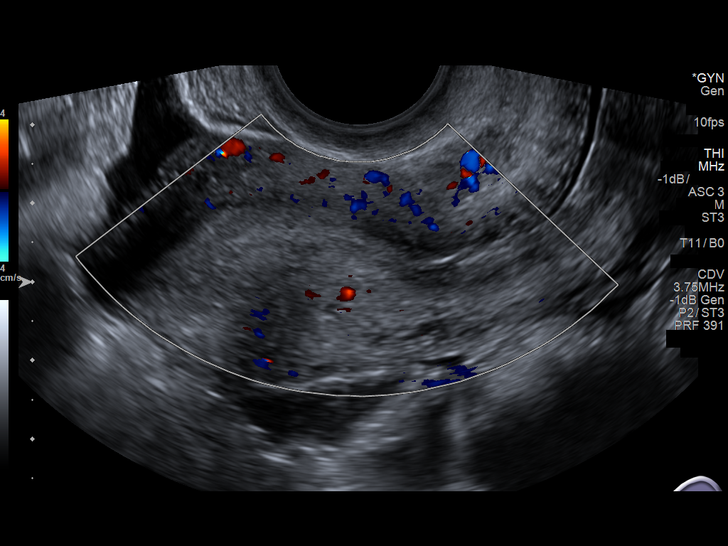
[im 35/93]
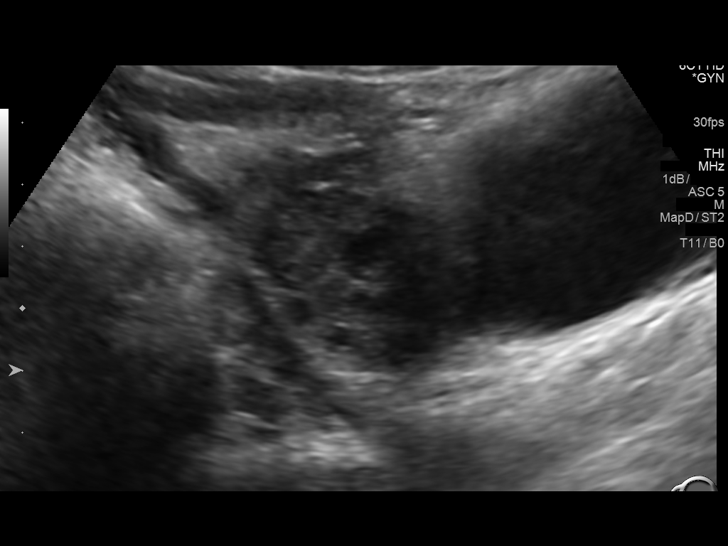
[im 43/93]
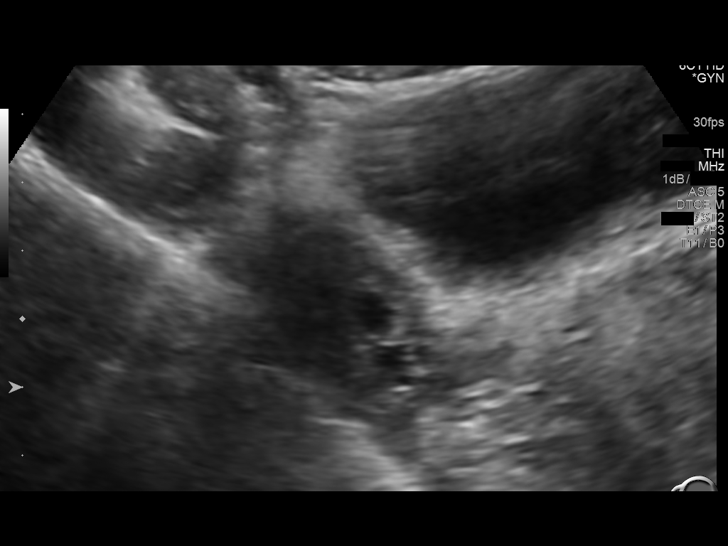
[im 50/93]
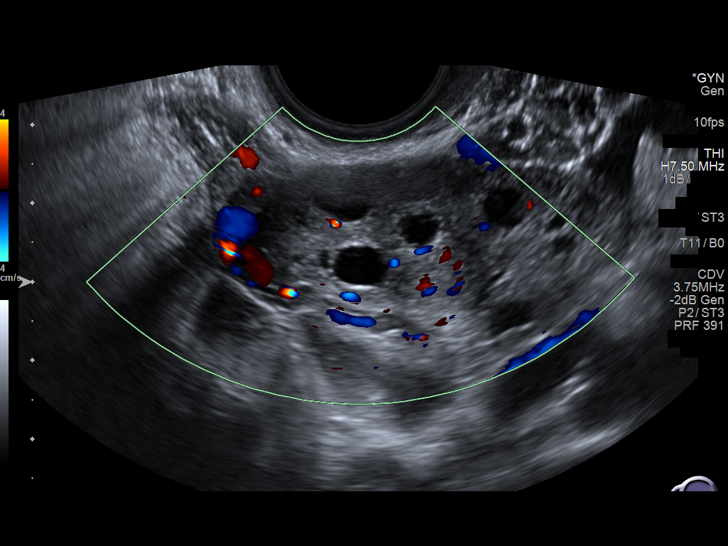
[im 58/93]
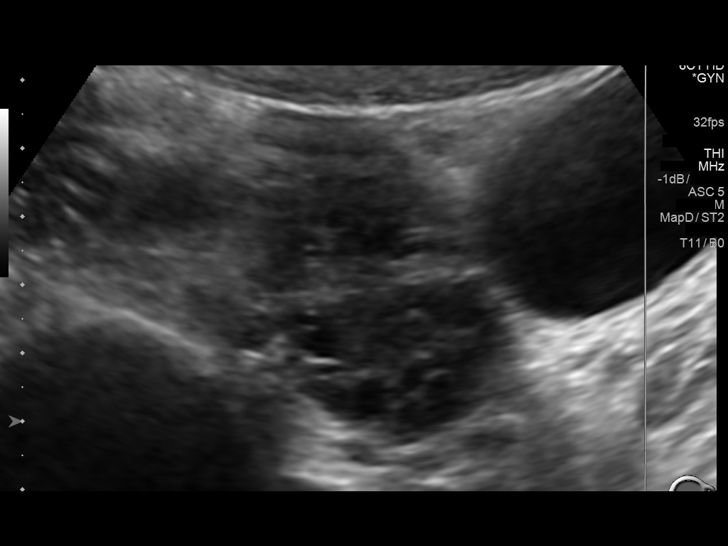
[im 62/93]
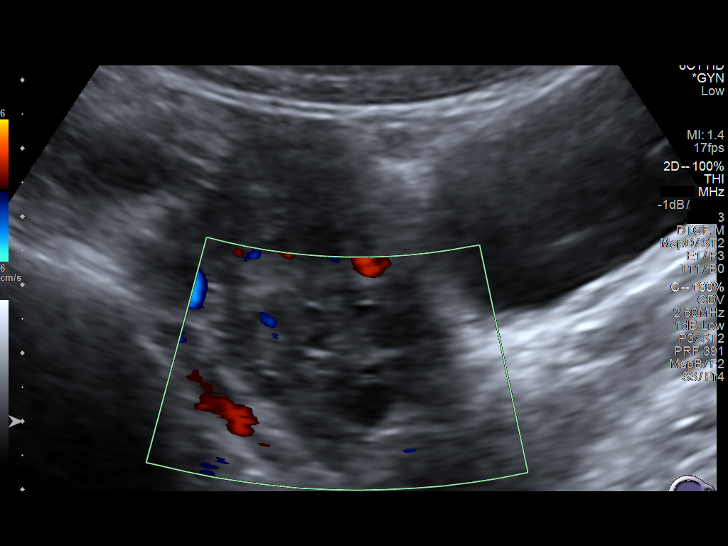
[im 70/93]
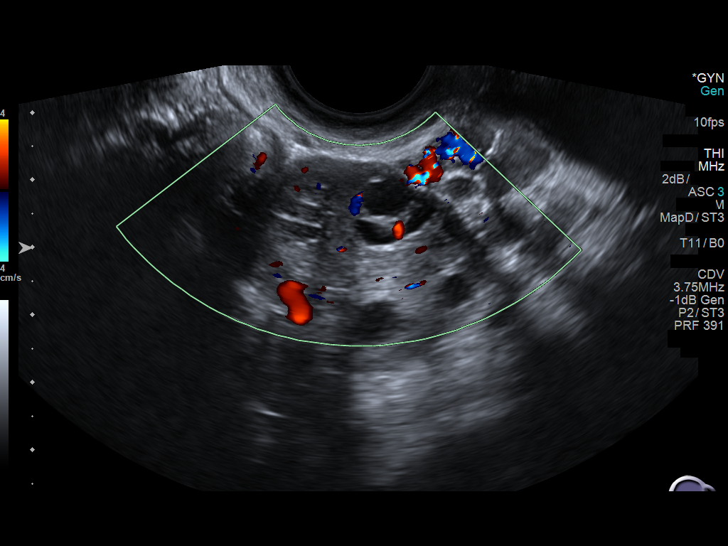
[im 77/93]
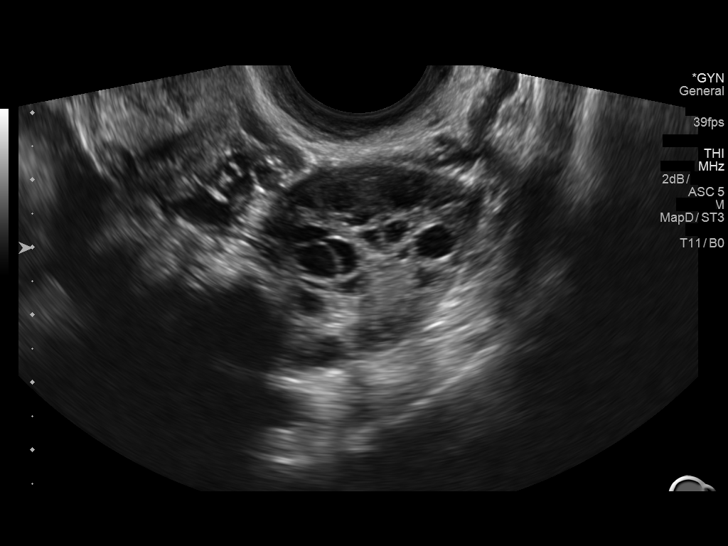
[im 85/93]
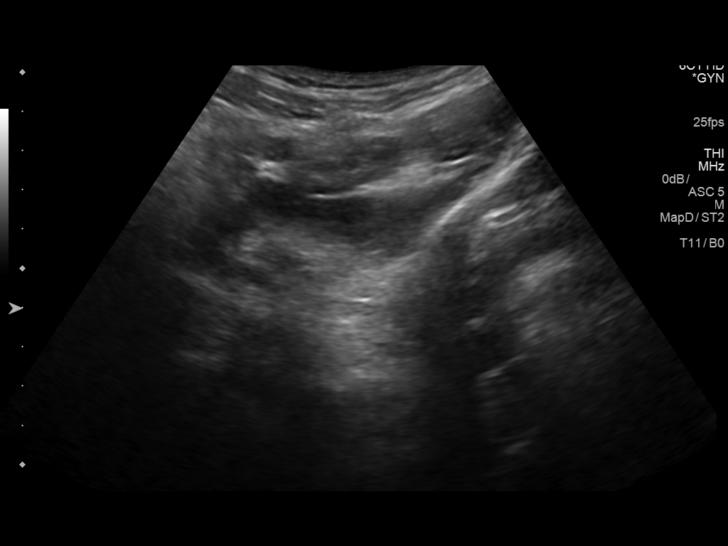
[im 93/93]
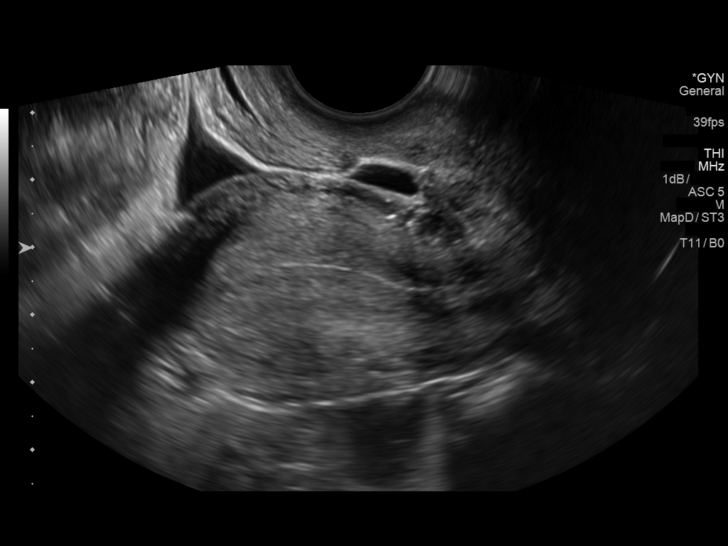

[14 of 25 positions shown; findings below may reference images not displayed]

FINDINGS: Uterus

Measurements: 8.5 x 3.3 x 3.9 cm.  Normal in morphology.

Endometrium

Thickness: Normal, 8 mm..  No focal abnormality visualized.

Right ovary

Measurements: 4.2 x 2.5 x 3.2 cm. Suspect a 2.0 cm corpus luteal
cyst with peripheral hypervascularity on image 68.

Left ovary

Measurements: 3.5 x 2.6 x 3.0 cm.  Normal in morphology.

Other findings

Trace free pelvic fluid is likely physiologic.
IMPRESSION: 1. Probable right ovarian corpus luteal cyst.
2. Otherwise normal pelvic ultrasound for age.

## 2020-01-28 ENCOUNTER — Encounter: Payer: Self-pay | Admitting: Advanced Practice Midwife

## 2020-01-28 ENCOUNTER — Ambulatory Visit (INDEPENDENT_AMBULATORY_CARE_PROVIDER_SITE_OTHER): Payer: BC Managed Care – PPO | Admitting: Advanced Practice Midwife

## 2020-01-28 ENCOUNTER — Ambulatory Visit: Payer: BC Managed Care – PPO | Admitting: Advanced Practice Midwife

## 2020-01-28 ENCOUNTER — Other Ambulatory Visit: Payer: Self-pay

## 2020-01-28 VITALS — BP 103/58 | HR 62 | Temp 98.7°F | Resp 16 | Ht 67.0 in | Wt 137.0 lb

## 2020-01-28 DIAGNOSIS — T839XXA Unspecified complication of genitourinary prosthetic device, implant and graft, initial encounter: Secondary | ICD-10-CM

## 2020-01-28 DIAGNOSIS — N921 Excessive and frequent menstruation with irregular cycle: Secondary | ICD-10-CM | POA: Diagnosis not present

## 2020-01-28 DIAGNOSIS — Z3009 Encounter for other general counseling and advice on contraception: Secondary | ICD-10-CM | POA: Diagnosis not present

## 2020-01-28 DIAGNOSIS — Z975 Presence of (intrauterine) contraceptive device: Secondary | ICD-10-CM | POA: Insufficient documentation

## 2020-01-28 DIAGNOSIS — R102 Pelvic and perineal pain: Secondary | ICD-10-CM

## 2020-01-28 NOTE — Patient Instructions (Signed)

## 2020-01-28 NOTE — Progress Notes (Signed)
   GYNECOLOGY CLINIC PROGRESS NOTE  History:  19 y.o. G0P0000 here at [facility] today for today for IUD string check; Kyleena IUD was placed  12/16/19 by Dr Penne Lash. Pt is having daily light bleeding, skin breakouts, and pelvic pain/cramping.  She also has a pulling sensation with intercourse.  She desires removal of the IUD.  The following portions of the patient's history were reviewed and updated as appropriate: allergies, current medications, past family history, past medical history, past social history, past surgical history and problem list.  Review of Systems:  Pertinent items are noted in HPI.   Objective:  Physical Exam Blood pressure (!) 103/58, pulse 62, temperature 98.7 F (37.1 C), resp. rate 16, height 5\' 7"  (1.702 m), weight 137 lb (62.1 kg), last menstrual period 01/28/2020, head circumference 16" (40.6 cm). Gen: NAD Abd: Soft, nontender and nondistended Pelvic: Normal appearing external genitalia; normal appearing vaginal mucosa and cervix.  IUD strings NOT visualized.  Cotton swab, Pap brush, and cervical hook used to attempt to visualize strings/remove IUD with no success.  Pt tolerated well.   Assessment & Plan:  1. Acute pelvic pain, female -STD testing negative 1 month ago.  Pain was immediate after IUD insertion per pt, it is intermittent but occurs daily.  It is low in her pelvis, like menstrual cramping.  - 01/30/2020 Pelvis Complete; Future  2. Complication of intrauterine device (IUD), unspecified complication, initial encounter [facility]) --IUD strings not visible today so unable to remove the device.  --Offered pt OCPs to decreased bleeding, possibly improve skin breakouts.  Unable to confirm IUD is in place today so pt needs a backup form of birth control. Pt declines OCPs and reports she is not currently sexually active. - [facility] Pelvis Complete; Future  3. Encounter for counseling regarding contraception --Discussed LARCs as most effective forms of birth control.   Discussed benefits/risks of other methods.  --Pt does not desire any contraception.  Discussed hormone free contraception as an option for pt including Paragard and/or condoms.  New methods on the market may be available as well that can be inserted before intercourse.   --Pt desires removal of IUD and plans abstinence for the near future for contraception.  Unable to remove IUD today, see procedure note above.    4. Breakthrough bleeding with IUD ----Offered treatment for pt side effects and discussed these as likely temporary with IUD. Pt desires IUD removal and feels uncomfortable with these symptoms. Pt declines OCPs to treat symptoms.  [place], CNM 3:20 PM

## 2020-01-30 ENCOUNTER — Ambulatory Visit (INDEPENDENT_AMBULATORY_CARE_PROVIDER_SITE_OTHER): Payer: BC Managed Care – PPO

## 2020-01-30 ENCOUNTER — Other Ambulatory Visit: Payer: Self-pay

## 2020-01-30 DIAGNOSIS — T839XXA Unspecified complication of genitourinary prosthetic device, implant and graft, initial encounter: Secondary | ICD-10-CM

## 2020-01-30 DIAGNOSIS — R102 Pelvic and perineal pain: Secondary | ICD-10-CM

## 2020-02-10 ENCOUNTER — Ambulatory Visit (INDEPENDENT_AMBULATORY_CARE_PROVIDER_SITE_OTHER): Payer: BC Managed Care – PPO | Admitting: Obstetrics & Gynecology

## 2020-02-10 ENCOUNTER — Encounter: Payer: Self-pay | Admitting: Obstetrics & Gynecology

## 2020-02-10 ENCOUNTER — Other Ambulatory Visit: Payer: Self-pay

## 2020-02-10 DIAGNOSIS — Z30432 Encounter for removal of intrauterine contraceptive device: Secondary | ICD-10-CM | POA: Diagnosis not present

## 2020-02-10 NOTE — Progress Notes (Signed)
    GYNECOLOGY OFFICE PROCEDURE NOTE  Lori Simpson is a 19 y.o. G0P0000 here for [facility] IUD removal. No GYN concerns.    IUD Removal  Patient identified, informed consent performed, consent signed.  Patient was in the dorsal lithotomy position, normal external genitalia was noted.  A speculum was placed in the patient's vagina, normal discharge was noted, no lesions. The cervix was visualized, no lesions, no abnormal discharge. The strings of the IUD were not visualized, so the cervix was prepped with Betadine.  The IUD hook was introduced into the endometrial cavity and the IUD was grasped and removed in its entirety.  Patient tolerated the procedure well.    Patient will use condoms for contraception.  Routine preventative health maintenance measures emphasized.   Jaynie Collins, MD, FACOG Obstetrician & Gynecologist, [facility] for [employer], [facility]

## 2020-02-27 ENCOUNTER — Encounter (HOSPITAL_COMMUNITY): Payer: Self-pay | Admitting: Emergency Medicine

## 2020-02-27 ENCOUNTER — Other Ambulatory Visit: Payer: Self-pay

## 2020-02-27 ENCOUNTER — Emergency Department (HOSPITAL_COMMUNITY)
Admission: EM | Admit: 2020-02-27 | Discharge: 2020-02-27 | Disposition: A | Discharge status: Home / Self Care | Payer: BC Managed Care – PPO | Attending: Emergency Medicine | Admitting: Emergency Medicine

## 2020-02-27 ENCOUNTER — Emergency Department (HOSPITAL_COMMUNITY): Payer: BC Managed Care – PPO

## 2020-02-27 DIAGNOSIS — Y929 Unspecified place or not applicable: Secondary | ICD-10-CM | POA: Diagnosis not present

## 2020-02-27 DIAGNOSIS — Y999 Unspecified external cause status: Secondary | ICD-10-CM | POA: Diagnosis not present

## 2020-02-27 DIAGNOSIS — X500XXA Overexertion from strenuous movement or load, initial encounter: Secondary | ICD-10-CM | POA: Diagnosis not present

## 2020-02-27 DIAGNOSIS — Y939 Activity, unspecified: Secondary | ICD-10-CM | POA: Insufficient documentation

## 2020-02-27 DIAGNOSIS — Z975 Presence of (intrauterine) contraceptive device: Secondary | ICD-10-CM | POA: Diagnosis not present

## 2020-02-27 DIAGNOSIS — F17210 Nicotine dependence, cigarettes, uncomplicated: Secondary | ICD-10-CM | POA: Diagnosis not present

## 2020-02-27 DIAGNOSIS — S46911A Strain of unspecified muscle, fascia and tendon at shoulder and upper arm level, right arm, initial encounter: Secondary | ICD-10-CM | POA: Diagnosis not present

## 2020-02-27 DIAGNOSIS — S59901A Unspecified injury of right elbow, initial encounter: Secondary | ICD-10-CM | POA: Diagnosis not present

## 2020-02-27 DIAGNOSIS — S56911A Strain of unspecified muscles, fascia and tendons at forearm level, right arm, initial encounter: Secondary | ICD-10-CM | POA: Diagnosis not present

## 2020-02-27 MED ORDER — IBUPROFEN 200 MG PO TABS: 600.0000 mg | ORAL_TABLET | Freq: Once | ORAL | Status: DC

## 2020-02-27 MED ORDER — IBUPROFEN 600 MG PO TABS: 600.0000 mg | ORAL_TABLET | Freq: Four times a day (QID) | ORAL | 0 refills | Status: DC | PRN

## 2020-02-27 NOTE — ED Provider Notes (Signed)
[facility] DEPT Provider Note   CSN: [id number] Arrival date & time: 02/27/20  0007     History Chief Complaint  Patient presents with  . Arm Injury    Lori Simpson is a 19 y.o. female.  Patient to ED with right elbow pain since this afternoon after 'popping' the joint earlier today as is her habit. Today she experienced pain without swelling or discoloration that extends from the posterior elbow to the hand. Her fingers feel "tight" and are tingling. No other injury. No similar previous symptoms when she has popped her joint. She is right hand dominant.   The history is provided by the patient. No language interpreter was used.  Arm Injury Associated symptoms: no fever        Past Medical History:  Diagnosis Date  . Frequent headaches     Patient Active Problem List   Diagnosis Date Noted  . IUD (intrauterine device) in place 01/28/2020  . High risk sexual behavior 12/03/2019  . Chlamydia 08/24/2018  . Tension headache 01/21/2016  . Migraine without aura and without status migrainosus, not intractable 01/21/2016  . Anxiety state 01/21/2016  . Depressed mood 01/21/2016  . 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 No pertinent surgical history.   OB History    Gravida  0   Para  0   Term  0   Preterm  0   AB  0   Living  0     SAB  0   TAB  0   Ectopic  0   Multiple  0   Live Births  0           Family History  Problem Relation Age of Onset  . Anxiety disorder Mother   . ADD / ADHD Mother   . ADD / ADHD Brother   . Migraines Paternal Aunt   . Bipolar disorder Maternal Grandmother   . Depression Maternal Grandmother   . Migraines Paternal Grandfather   . Epilepsy Other        [facility]    Social History   Tobacco Use  . Smoking status: Current Every Day Smoker    Types: Cigarettes  . Smokeless tobacco: Never Used  Substance Use Topics  . Alcohol use: Yes    Alcohol/week: 0.0  standard drinks  . Drug use: Yes    Types: Marijuana    Home Medications Prior to Admission medications   Medication Sig Start Date End Date Taking? Authorizing Provider  Levonorgestrel (KYLEENA IU) by Intrauterine route.    [provider]    Allergies    Other  Review of Systems   Review of Systems  Constitutional: Negative for fever.  Musculoskeletal: Positive for arthralgias. Negative for joint swelling.  Skin: Negative for color change.  Neurological: Negative for weakness.       Tingling to right fingers.    Physical Exam Updated Vital Signs BP 126/74 (BP Location: Left Arm)   Pulse 81   Temp 98.7 F (37.1 C) (Oral)   Resp 15   Ht 5\' 6"  (1.676 m)   Wt 58.1 kg   LMP 01/28/2020   SpO2 99%   BMI 20.66 kg/m   Physical Exam Constitutional:      Appearance: She is well-developed.  Pulmonary:     Effort: Pulmonary effort is normal.  Musculoskeletal:     Cervical back: Normal range of motion.     Comments: Right arm is normal in  appearance. No swelling or discoloration. There is generalized elbow tenderness with preserved ROM. No pain on supination/pronation at wrist. FROM all joints of the right extremity. Cap RF <2s.  Skin:    General: Skin is warm and dry.     Findings: No erythema.  Neurological:     Mental Status: She is alert and oriented to person, place, and time.     Sensory: No sensory deficit.     ED Results / Procedures / Treatments   Labs (all labs ordered are listed, but only abnormal results are displayed) Labs Reviewed - No data to display  EKG None  Radiology DG Elbow Complete Right  Result Date: 02/27/2020 CLINICAL DATA:  Elbow injury and pain EXAM: RIGHT ELBOW - COMPLETE 3+ VIEW COMPARISON:  None. FINDINGS: There is no evidence of fracture, dislocation, or joint effusion. There is no evidence of arthropathy or other focal bone abnormality. Soft tissues are unremarkable. IMPRESSION: Negative. Electronically Signed   By: Jonna Clark M.D.   On: 02/27/2020 01:30    Procedures Procedures (including critical care time)  Medications Ordered in ED Medications - No data to display  ED Course  I have reviewed the triage vital signs and the nursing notes.  Pertinent labs & imaging results that were available during my care of the patient were reviewed by me and considered in my medical decision making (see chart for details).    MDM Rules/Calculators/A&P                      Patient to ED with right elbow pain and soreness since popping the joint earlier today.   Exam is unremarkable with the exception of mild tenderness. The elbow is not unstable, there is no swelling or evidence dislocation or subluxation. Imaging is negative.   The patient is reassured. Will start on ibuprofen 600 mg, recommend cool/warm compresses. Will provide referral to ortho in the event there is no improvement in 3-4 days.   Final Clinical Impression(s) / ED Diagnoses Final diagnoses:  None   1. Right elbow strain  Rx / DC Orders ED Discharge Orders    None       Danne Harbor 02/27/20 0211    Palumbo, April, MD 02/27/20 [phone]

## 2020-02-27 NOTE — Discharge Instructions (Addendum)
Take ibuprofen as directed. Apply cool compresses for the first 48 hours as instructed, then use alternating cool and warm for comfort.   If you elbow pain is no better in 3-4 days, make an appointment with Dr. Roda Shutters for further evaluation.

## 2020-02-27 NOTE — ED Triage Notes (Addendum)
Patient is complaining of Right elbow. Patient does not know if she pulled a muscle or not. She has a sharp pain when she moves her elbow.

## 2020-04-14 ENCOUNTER — Other Ambulatory Visit (HOSPITAL_COMMUNITY)
Admission: RE | Admit: 2020-04-14 | Discharge: 2020-04-14 | Disposition: A | Discharge status: Home / Self Care | Payer: BC Managed Care – PPO | Source: Ambulatory Visit | Attending: Women's Health | Admitting: Women's Health

## 2020-04-14 ENCOUNTER — Ambulatory Visit (INDEPENDENT_AMBULATORY_CARE_PROVIDER_SITE_OTHER): Payer: BC Managed Care – PPO | Admitting: Women's Health

## 2020-04-14 ENCOUNTER — Other Ambulatory Visit: Payer: Self-pay

## 2020-04-14 ENCOUNTER — Encounter: Payer: Self-pay | Admitting: Women's Health

## 2020-04-14 VITALS — BP 106/67 | HR 61 | Ht 67.0 in | Wt 138.0 lb

## 2020-04-14 DIAGNOSIS — N898 Other specified noninflammatory disorders of vagina: Secondary | ICD-10-CM

## 2020-04-14 MED ORDER — FLUCONAZOLE 150 MG PO TABS: 150.0000 mg | ORAL_TABLET | Freq: Once | ORAL | 0 refills | Status: AC

## 2020-04-14 NOTE — Progress Notes (Signed)
  History:  Ms. Lori Simpson is a 19 y.o. G0P0000 who presents to clinic today for vaginal itching x 4days. Patient also endorses thick, white discharge. Patient reports itching is at entrance to the vagina. Patient reports yeast infection in the past and that this feels the same. Patient denies allergies to any medications. Patient reports no medications at this time.  The following portions of the patient's history were reviewed and updated as appropriate: allergies, current medications, family history, past medical history, social history, past surgical history and problem list.  Review of Systems:  Review of Systems  Constitutional: Negative for chills and fever.  Respiratory: Negative for cough and shortness of breath.   Cardiovascular: Negative for chest pain.  Gastrointestinal: Negative for abdominal pain, nausea and vomiting.  Genitourinary: Negative for dysuria, frequency and urgency.     Objective:  Physical Exam BP 106/67   Pulse 61   Ht 5\' 7"  (1.702 m)   Wt 138 lb (62.6 kg)   LMP 04/07/2020   BMI 21.61 kg/m  Physical Exam  Constitutional: She is oriented to person, place, and time. She appears well-developed and well-nourished. No distress.  HENT:  Head: Normocephalic and atraumatic.  Respiratory: Effort normal.  GI: Soft.  Genitourinary: There is no rash, tenderness or lesion on the right labia. There is no rash, tenderness or lesion on the left labia.    Vaginal discharge (thick, white, odorless, clumped discharge) present.     No vaginal tenderness or bleeding.  No tenderness or bleeding in the vagina.  Neurological: She is alert and oriented to person, place, and time.  Skin: Skin is warm and dry. She is not diaphoretic.  Psychiatric: She has a normal mood and affect. Her behavior is normal. Judgment and thought content normal.   Labs and Imaging No results found for this or any previous visit (from the past 24 hour(s)).  No results found.   Assessment &  Plan:  1. Vaginal discharge - Cervicovaginal ancillary only( [place]) - fluconazole (DIFLUCAN) 150 MG tablet; Take 1 tablet (150 mg total) by mouth once for 1 dose.  Dispense: 1 tablet; Refill: 0  Approximately 10 minutes of face-to-face time was spent with this patient   Aneri Slagel, 06/07/2020, NP 04/14/2020 12:02 PM

## 2020-04-14 NOTE — Progress Notes (Signed)
Pt c/o vaginal discharge and vaginal itching °

## 2020-04-15 ENCOUNTER — Telehealth: Payer: Self-pay

## 2020-04-15 DIAGNOSIS — N760 Acute vaginitis: Secondary | ICD-10-CM

## 2020-04-15 DIAGNOSIS — A749 Chlamydial infection, unspecified: Secondary | ICD-10-CM

## 2020-04-15 DIAGNOSIS — B379 Candidiasis, unspecified: Secondary | ICD-10-CM

## 2020-04-15 LAB — CERVICOVAGINAL ANCILLARY ONLY
Bacterial Vaginitis (gardnerella): POSITIVE — AB
Candida Glabrata: NEGATIVE
Candida Vaginitis: POSITIVE — AB
Chlamydia: POSITIVE — AB
Comment: NEGATIVE
Comment: NEGATIVE
Comment: NEGATIVE
Comment: NEGATIVE
Comment: NEGATIVE
Comment: NORMAL
Neisseria Gonorrhea: NEGATIVE
Trichomonas: NEGATIVE

## 2020-04-15 MED ORDER — METRONIDAZOLE 500 MG PO TABS: 500.0000 mg | ORAL_TABLET | Freq: Two times a day (BID) | ORAL | 0 refills | Status: DC

## 2020-04-15 MED ORDER — FLUCONAZOLE 150 MG PO TABS: 150.0000 mg | ORAL_TABLET | Freq: Once | ORAL | 0 refills | Status: AC

## 2020-04-15 MED ORDER — DOXYCYCLINE HYCLATE 50 MG PO CAPS: ORAL_CAPSULE | ORAL | 0 refills | Status: DC

## 2020-04-15 NOTE — Telephone Encounter (Signed)
Spoke with pt and she is aware of positive BV, Yeast and Chlamydia results. Flagyl, Diflucan and Doxycycline sent in per protocol and Donia Ast, NP. Pt is aware that she will need to come to office for TOC at least 3 weeks after treatment. Pt states she will call to schedule. Pt is also aware that her partner needs to be seen and treated as well due to Chlamydia diagnosis.

## 2020-04-16 ENCOUNTER — Other Ambulatory Visit: Payer: Self-pay | Admitting: Women's Health

## 2020-04-16 DIAGNOSIS — A749 Chlamydial infection, unspecified: Secondary | ICD-10-CM

## 2020-04-16 MED ORDER — AZITHROMYCIN 500 MG PO TABS: 1000.0000 mg | ORAL_TABLET | Freq: Once | ORAL | 0 refills | Status: AC

## 2020-04-16 NOTE — Progress Notes (Signed)
RX azithromycin for chlamydia.  Marylen Ponto, NP  1:57 PM 04/16/2020

## 2020-06-23 DIAGNOSIS — Z7251 High risk heterosexual behavior: Secondary | ICD-10-CM | POA: Diagnosis not present

## 2020-06-23 DIAGNOSIS — Z113 Encounter for screening for infections with a predominantly sexual mode of transmission: Secondary | ICD-10-CM | POA: Diagnosis not present

## 2020-06-23 DIAGNOSIS — N760 Acute vaginitis: Secondary | ICD-10-CM | POA: Diagnosis not present

## 2020-09-29 ENCOUNTER — Ambulatory Visit: Payer: BC Managed Care – PPO | Admitting: Advanced Practice Midwife

## 2020-10-16 ENCOUNTER — Other Ambulatory Visit: Payer: Self-pay

## 2020-10-16 ENCOUNTER — Encounter: Payer: Self-pay | Admitting: Certified Nurse Midwife

## 2020-10-16 ENCOUNTER — Ambulatory Visit (INDEPENDENT_AMBULATORY_CARE_PROVIDER_SITE_OTHER): Payer: BC Managed Care – PPO | Admitting: Certified Nurse Midwife

## 2020-10-16 ENCOUNTER — Other Ambulatory Visit (HOSPITAL_COMMUNITY)
Admission: RE | Admit: 2020-10-16 | Discharge: 2020-10-16 | Disposition: A | Discharge status: Home / Self Care | Payer: BC Managed Care – PPO | Source: Ambulatory Visit | Attending: Certified Nurse Midwife | Admitting: Certified Nurse Midwife

## 2020-10-16 VITALS — BP 104/73 | HR 76 | Resp 16 | Ht 67.0 in | Wt 135.0 lb

## 2020-10-16 DIAGNOSIS — N898 Other specified noninflammatory disorders of vagina: Secondary | ICD-10-CM

## 2020-10-16 DIAGNOSIS — N760 Acute vaginitis: Secondary | ICD-10-CM | POA: Diagnosis not present

## 2020-10-16 DIAGNOSIS — B373 Candidiasis of vulva and vagina: Secondary | ICD-10-CM | POA: Diagnosis not present

## 2020-10-16 DIAGNOSIS — B9689 Other specified bacterial agents as the cause of diseases classified elsewhere: Secondary | ICD-10-CM | POA: Insufficient documentation

## 2020-10-16 NOTE — Progress Notes (Signed)
   GYNECOLOGY OFFICE VISIT NOTE  History:  19 y.o. G0P0000 here today for vaginal malodor. Sx started shortly after having unprotected sex last week with a new partner. Denies itching, irritation, or discharge. She denies any abnormal bleeding, pelvic pain or other concerns.   Past Medical History:  Diagnosis Date  . Frequent headaches     No past surgical history on file.  The following portions of the patient's history were reviewed and updated as appropriate: allergies, current medications, past family history, past medical history, past social history, past surgical history and problem list.    Review of Systems:  Negative except noted in HPI Genito-Urinary ROS: no dysuria, trouble voiding, or hematuria negative for - genital discharge, pelvic pain, urinary frequency/urgency or vulvar/vaginal symptoms   Objective:  Physical Exam BP 104/73   Pulse 76   Resp 16   Ht 5\' 7"  (1.702 m)   Wt 135 lb (61.2 kg)   LMP 09/29/2020   BMI 21.14 kg/m  CONSTITUTIONAL: Well-developed, well-nourished female in no acute distress.  HENT:  Normocephalic, atraumatic EYES: Conjunctivae and EOM are normal NECK: Normal range of motion SKIN: Skin is warm and dry NEUROLOGIC: Alert and oriented to person, place, and time PSYCHIATRIC: Normal mood and affect CARDIOVASCULAR: Normal heart rate noted RESPIRATORY: Effort and rate normal ABDOMEN: Soft, no distention  PELVIC: Normal appearing external genitalia; normal appearing vaginal mucosa and cervix.  No abnormal discharge noted.   MUSCULOSKELETAL: Normal range of motion  Labs and Imaging No results found for this or any previous visit (from the past 24 hour(s)).  Assessment & Plan:   1. Vaginal odor   -Aptima swab  Follow up yearly or prn  Total face-to-face time with patient: 10 minutes  10/01/2020, CNM 10/16/2020 11:27 AM

## 2020-10-20 LAB — CERVICOVAGINAL ANCILLARY ONLY
Bacterial Vaginitis (gardnerella): POSITIVE — AB
Candida Glabrata: NEGATIVE
Candida Vaginitis: POSITIVE — AB
Chlamydia: NEGATIVE
Comment: NEGATIVE
Comment: NEGATIVE
Comment: NEGATIVE
Comment: NEGATIVE
Comment: NEGATIVE
Comment: NORMAL
Neisseria Gonorrhea: NEGATIVE
Trichomonas: NEGATIVE

## 2020-10-21 ENCOUNTER — Telehealth: Payer: Self-pay | Admitting: *Deleted

## 2020-10-21 MED ORDER — FLUCONAZOLE 150 MG PO TABS: 150.0000 mg | ORAL_TABLET | Freq: Once | ORAL | 0 refills | Status: AC

## 2020-10-21 MED ORDER — METRONIDAZOLE 500 MG PO TABS: 500.0000 mg | ORAL_TABLET | Freq: Two times a day (BID) | ORAL | 0 refills | Status: DC

## 2020-10-21 NOTE — Telephone Encounter (Signed)
Pt notified of positive BV and yeast on culture.  Per VO M Bhambri,CNM RX was sent to pharmacy for Flagyl and Diflucan.

## 2020-10-21 NOTE — Telephone Encounter (Signed)
-----   Message from Donette Larry, [place] sent at 10/21/2020 10:04 AM EST ----- +yeast and BV, please send Flagyl and Diflucan

## 2021-01-28 ENCOUNTER — Other Ambulatory Visit (HOSPITAL_COMMUNITY)
Admission: RE | Admit: 2021-01-28 | Discharge: 2021-01-28 | Disposition: A | Discharge status: Home / Self Care | Payer: BC Managed Care – PPO | Source: Ambulatory Visit | Attending: Obstetrics and Gynecology | Admitting: Obstetrics and Gynecology

## 2021-01-28 ENCOUNTER — Other Ambulatory Visit: Payer: Self-pay

## 2021-01-28 ENCOUNTER — Encounter: Payer: Self-pay | Admitting: Obstetrics and Gynecology

## 2021-01-28 ENCOUNTER — Ambulatory Visit (INDEPENDENT_AMBULATORY_CARE_PROVIDER_SITE_OTHER): Payer: BC Managed Care – PPO | Admitting: Obstetrics and Gynecology

## 2021-01-28 VITALS — BP 105/67 | HR 78 | Ht 67.0 in | Wt 125.0 lb

## 2021-01-28 DIAGNOSIS — N898 Other specified noninflammatory disorders of vagina: Secondary | ICD-10-CM | POA: Insufficient documentation

## 2021-01-28 DIAGNOSIS — N926 Irregular menstruation, unspecified: Secondary | ICD-10-CM

## 2021-01-28 DIAGNOSIS — Z01419 Encounter for gynecological examination (general) (routine) without abnormal findings: Secondary | ICD-10-CM | POA: Diagnosis not present

## 2021-01-28 DIAGNOSIS — Z124 Encounter for screening for malignant neoplasm of cervix: Secondary | ICD-10-CM

## 2021-01-28 DIAGNOSIS — Z113 Encounter for screening for infections with a predominantly sexual mode of transmission: Secondary | ICD-10-CM | POA: Diagnosis not present

## 2021-01-28 DIAGNOSIS — B9689 Other specified bacterial agents as the cause of diseases classified elsewhere: Secondary | ICD-10-CM | POA: Insufficient documentation

## 2021-01-28 DIAGNOSIS — K6289 Other specified diseases of anus and rectum: Secondary | ICD-10-CM

## 2021-01-28 DIAGNOSIS — N6321 Unspecified lump in the left breast, upper outer quadrant: Secondary | ICD-10-CM

## 2021-01-28 DIAGNOSIS — N760 Acute vaginitis: Secondary | ICD-10-CM | POA: Insufficient documentation

## 2021-01-28 DIAGNOSIS — L680 Hirsutism: Secondary | ICD-10-CM

## 2021-01-28 MED ORDER — NORGESTIMATE-ETH ESTRADIOL 0.25-35 MG-MCG PO TABS: 1.0000 | ORAL_TABLET | Freq: Every day | ORAL | 11 refills | Status: DC

## 2021-01-28 NOTE — Progress Notes (Signed)
GYNECOLOGY ANNUAL PREVENTATIVE CARE ENCOUNTER NOTE  Subjective:   Lori Simpson is a 20 y.o. G0P0000 female here for a annual gynecologic exam. Current complaints: vaginal odor.  Improved with Azo. Has bump on bottom she thinks is an ingrown hair. Irregular periods, occur monthly but are unpredictable. Does not want contraception but okay with pills as she would like to have regular periods. Wants STI testing. Needs pap. Left breast lump she noted last month.  Denies abnormal discharge, pelvic pain, problems with intercourse or other gynecologic concerns. Accepts STI screen.   Gynecologic History Patient's last menstrual period was 01/24/2021. Contraception: abstinence Last Pap: n/a Last mammogram: n/a Gardisil: ?  Obstetric History OB History  Gravida Para Term Preterm AB Living  0 0 0 0 0 0  SAB IAB Ectopic Multiple Live Births  0 0 0 0 0    Past Medical History:  Diagnosis Date  . Frequent headaches     History reviewed. No pertinent surgical history.  No current outpatient medications on file prior to visit.   No current facility-administered medications on file prior to visit.    Allergies  Allergen Reactions  . Other Itching, Swelling and Other (See Comments)    Pineapple causes swelling and itchy mouth Seasonal allergies    Social History   Socioeconomic History  . Marital status: Single    Spouse name: Not on file  . Number of children: Not on file  . Years of education: Not on file  . Highest education level: Not on file  Occupational History  . Occupation: Consulting civil engineer  Tobacco Use  . Smoking status: Current Every Day Smoker    Types: Cigarettes  . Smokeless tobacco: Never Used  Vaping Use  . Vaping Use: Never used  Substance and Sexual Activity  . Alcohol use: Yes    Alcohol/week: 0.0 standard drinks  . Drug use: Yes    Types: Marijuana  . Sexual activity: Yes    Partners: Male    Birth control/protection: Condom  Other Topics Concern   . Not on file  Social History Narrative   Zoiee attends 10 th grade at [employer]. She is doing average.    Lives with her father.    Social Determinants of Health   Financial Resource Strain: Not on file  Food Insecurity: Not on file  Transportation Needs: Not on file  Physical Activity: Not on file  Stress: Not on file  Social Connections: Not on file  Intimate Partner Violence: Not on file    Family History  Problem Relation Age of Onset  . Anxiety disorder Mother   . ADD / ADHD Mother   . ADD / ADHD Brother   . Migraines Paternal Aunt   . Bipolar disorder Maternal Grandmother   . Depression Maternal Grandmother   . Migraines Paternal Grandfather   . Epilepsy Other        [facility]    The following portions of the patient's history were reviewed and updated as appropriate: allergies, current medications, past family history, past medical history, past social history, past surgical history and problem list.  Review of Systems Pertinent items are noted in HPI.   Objective:  BP 105/67   Pulse 78   Ht 5\' 7"  (1.702 m)   Wt 125 lb (56.7 kg)   LMP 01/24/2021   BMI 19.58 kg/m  CONSTITUTIONAL: Well-developed, well-nourished female in no acute distress.  HENT:  Normocephalic, atraumatic, External right and left ear normal. Oropharynx is  clear and moist EYES: Conjunctivae and EOM are normal. Pupils are equal, round, and reactive to light. No scleral icterus.  NECK: Normal range of motion, supple, no masses.  Normal thyroid.  SKIN: Skin is warm and dry. No rash noted. Not diaphoretic. No erythema. No pallor. NEUROLOGIC: Alert and oriented to person, place, and time. Normal reflexes, muscle tone coordination. No cranial nerve deficit noted. PSYCHIATRIC: Normal mood and affect. Normal behavior. Normal judgment and thought content. CARDIOVASCULAR: Normal heart rate noted RESPIRATORY: Effort normal, no problems with respiration noted. BREASTS: Symmetric in size. Let sided  mobile non-tender lump at 2 o'clock, approx 2 cm in diameter approx 2 cm from areola. No other masses, skin changes, nipple drainage, or lymphadenopathy. ABDOMEN: Soft, no distention noted.  No tenderness, rebound or guarding.  PELVIC: Normal appearing external genitalia; normal appearing vaginal mucosa and cervix.  No abnormal discharge noted.  Pap smear obtained. Pelvic cultures obtained. Normal uterine size, no other palpable masses, no uterine or adnexal tenderness. Small area of irritation at anus, appears to be ingrown hair MUSCULOSKELETAL: Normal range of motion. No tenderness.  No cyanosis, clubbing, or edema.  2+ distal pulses.  Exam done with chaperone present.  Assessment and Plan:   1. Well woman exam Healthy female exam  2. Cervical cancer screening - Cytology - PAP( [place])  3. Vaginal odor Check urine since Azo improved symptoms - Cervicovaginal ancillary only( [place]) - Urine Culture  4. Routine screening for STI (sexually transmitted infection) - Hepatitis B surface antigen - Hepatitis C antibody - HIV Antibody (routine testing w rflx) - RPR  5. Hirsutism Return for discussion about irregular menses and possible PCOS  6. Irregular menses - start OCPs today - reviewed risks/benefits, she is agreeable  7. Anus irritation Appears to be ingrown hair  8. Breast lump on left side at 2 o'clock position Lump palpable on exam - [place] ordered - US BREAST LTD UNI LEFT INC AXILLA; Future   Will follow up results of pap smear/STI screen and manage accordingly. Encouraged improvement in diet and exercise.  Accepts STI screen. COVID vaccine ?UTD Mammogram n/a Referral for colonoscopy n/a Flu vaccine  Gardisil   Routine preventative health maintenance measures emphasized. Please refer to After Visit Summary for other counseling recommendations.    Baldemar Lenis, MD, [facility] Attending Center for [employer] [facility])

## 2021-01-29 LAB — CERVICOVAGINAL ANCILLARY ONLY
Bacterial Vaginitis (gardnerella): POSITIVE — AB
Candida Glabrata: NEGATIVE
Candida Vaginitis: NEGATIVE
Chlamydia: NEGATIVE
Comment: NEGATIVE
Comment: NEGATIVE
Comment: NEGATIVE
Comment: NEGATIVE
Comment: NEGATIVE
Comment: NORMAL
Neisseria Gonorrhea: NEGATIVE
Trichomonas: NEGATIVE

## 2021-02-01 ENCOUNTER — Telehealth: Payer: Self-pay | Admitting: *Deleted

## 2021-02-01 LAB — CYTOLOGY - PAP: Diagnosis: NEGATIVE

## 2021-02-01 MED ORDER — METRONIDAZOLE 500 MG PO TABS: 500.0000 mg | ORAL_TABLET | Freq: Two times a day (BID) | ORAL | 0 refills | Status: DC

## 2021-02-01 NOTE — Telephone Encounter (Signed)
Pt notified of positive BV and per protocol Flagyl 500 mg BID sent to [facility].

## 2021-02-11 ENCOUNTER — Ambulatory Visit (INDEPENDENT_AMBULATORY_CARE_PROVIDER_SITE_OTHER): Payer: BC Managed Care – PPO | Admitting: Obstetrics and Gynecology

## 2021-02-11 ENCOUNTER — Encounter: Payer: Self-pay | Admitting: Obstetrics and Gynecology

## 2021-02-11 ENCOUNTER — Other Ambulatory Visit: Payer: Self-pay

## 2021-02-11 VITALS — BP 94/55 | HR 53 | Resp 19 | Ht 67.0 in | Wt 125.0 lb

## 2021-02-11 DIAGNOSIS — E282 Polycystic ovarian syndrome: Secondary | ICD-10-CM | POA: Diagnosis not present

## 2021-02-11 NOTE — Progress Notes (Signed)
20 yo P0 with BMI 19 presenting today for discussion on possible PCOS diagnosis. Patient reports a history of irregular menses with cycle lenght ranging from 2 weeks to 56 days. Patient recently started COC for cycle control. She reports facial hair as well. Patient denies breast discharge, frequent headaches or changes in her vision. Patient denies heat/cold intolerance or weight gain or loss. Patient is without any other complaints. She is awaiting breast ultrasound previously ordered.  Past Medical History:  Diagnosis Date  . Frequent headaches    History reviewed. No pertinent surgical history. Family History  Problem Relation Age of Onset  . Anxiety disorder Mother   . ADD / ADHD Mother   . ADD / ADHD Brother   . Migraines Paternal Aunt   . Bipolar disorder Maternal Grandmother   . Depression Maternal Grandmother   . Migraines Paternal Grandfather   . Epilepsy Other        [facility]   Social History   Tobacco Use  . Smoking status: Current Every Day Smoker    Types: Cigarettes  . Smokeless tobacco: Never Used  Vaping Use  . Vaping Use: Never used  Substance Use Topics  . Alcohol use: Yes    Alcohol/week: 0.0 standard drinks  . Drug use: Yes    Types: Marijuana   ROS See pertinent in HPI. All other systems reviewed and non contributory Blood pressure (!) 94/55, pulse (!) 53, resp. rate 19, height 5\' 7"  (1.702 m), weight 125 lb (56.7 kg), last menstrual period 01/24/2021.  GENERAL: Well-developed, well-nourished female in no acute distress.  NEURO: alert and oriented x 3  A/P 20 yo P0 with possible PCOS - Information provided - Encouraged patient to continue COC for cycle control - patient more preoccupied with having breast ultrasound- will try to schedule ultrasound for this week - All questions were answered - RTC prn

## 2021-02-15 ENCOUNTER — Ambulatory Visit
Admission: RE | Admit: 2021-02-15 | Discharge: 2021-02-15 | Disposition: A | Discharge status: Home / Self Care | Payer: BC Managed Care – PPO | Source: Ambulatory Visit | Attending: Obstetrics and Gynecology | Admitting: Obstetrics and Gynecology

## 2021-02-15 ENCOUNTER — Other Ambulatory Visit: Payer: Self-pay

## 2021-02-15 DIAGNOSIS — N644 Mastodynia: Secondary | ICD-10-CM | POA: Diagnosis not present

## 2021-02-15 DIAGNOSIS — N6321 Unspecified lump in the left breast, upper outer quadrant: Secondary | ICD-10-CM

## 2021-04-09 IMAGING — CR DG ELBOW COMPLETE 3+V*R*
4 series · 4 of 4 positions shown · non-contrast
Comparison: None.

CLINICAL DATA: Elbow injury and pain

EXAM:
RIGHT ELBOW - COMPLETE 3+ VIEW

[x elbow ap right]
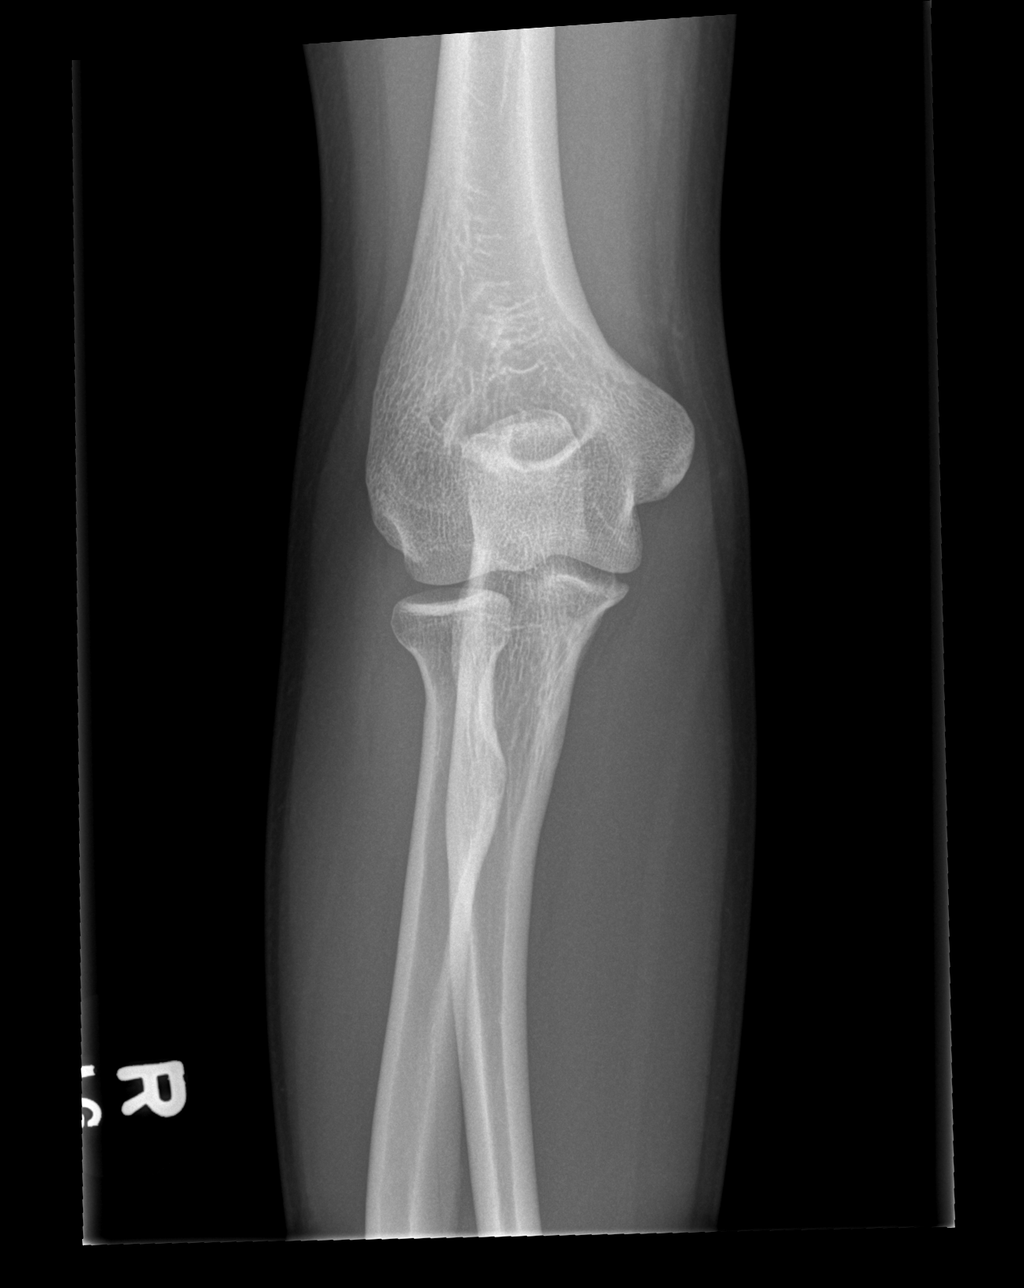

[x elbow obl right (1 of 2)]
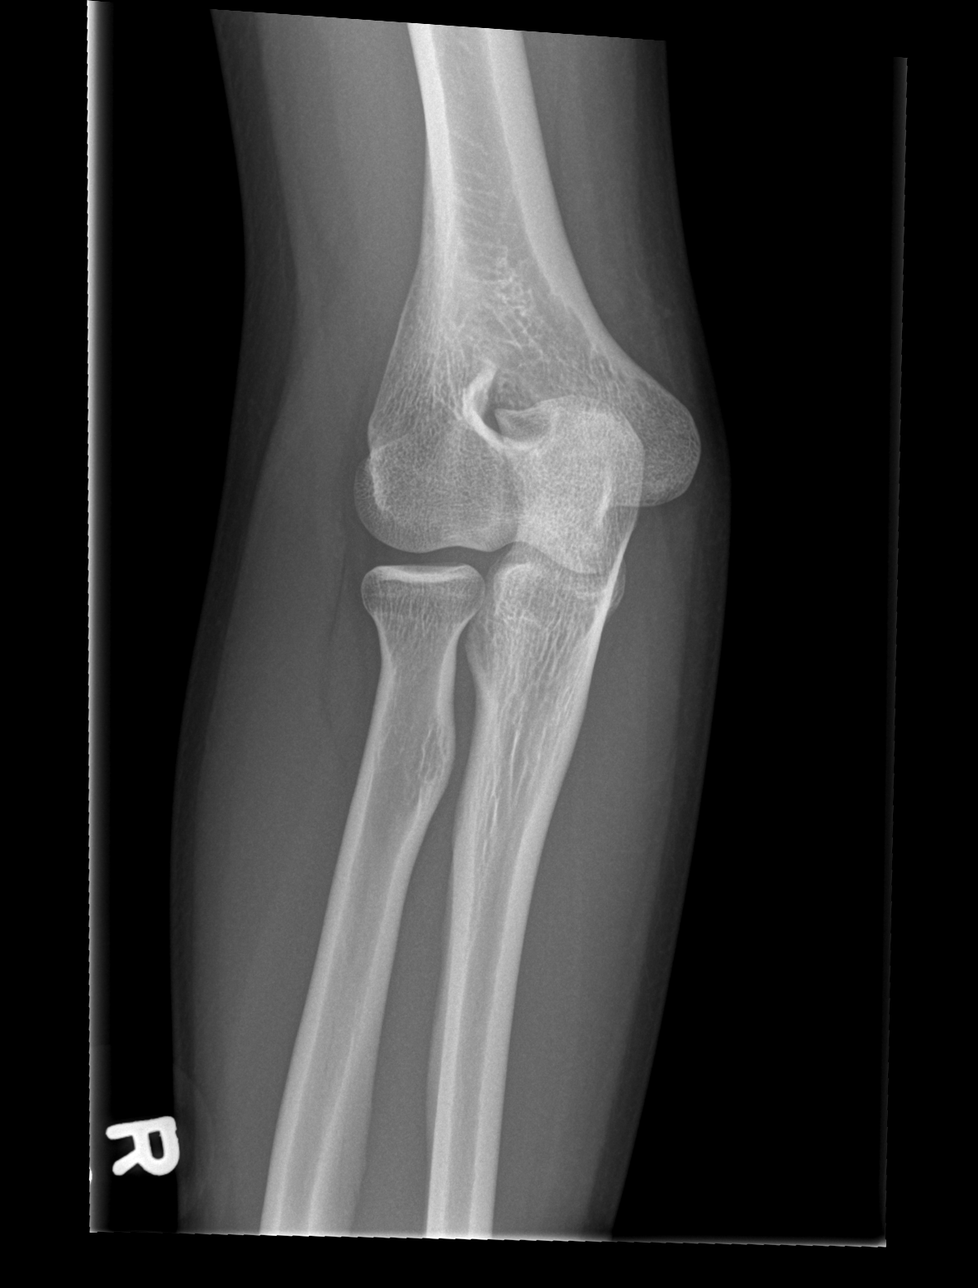

[x elbow obl right (2 of 2)]
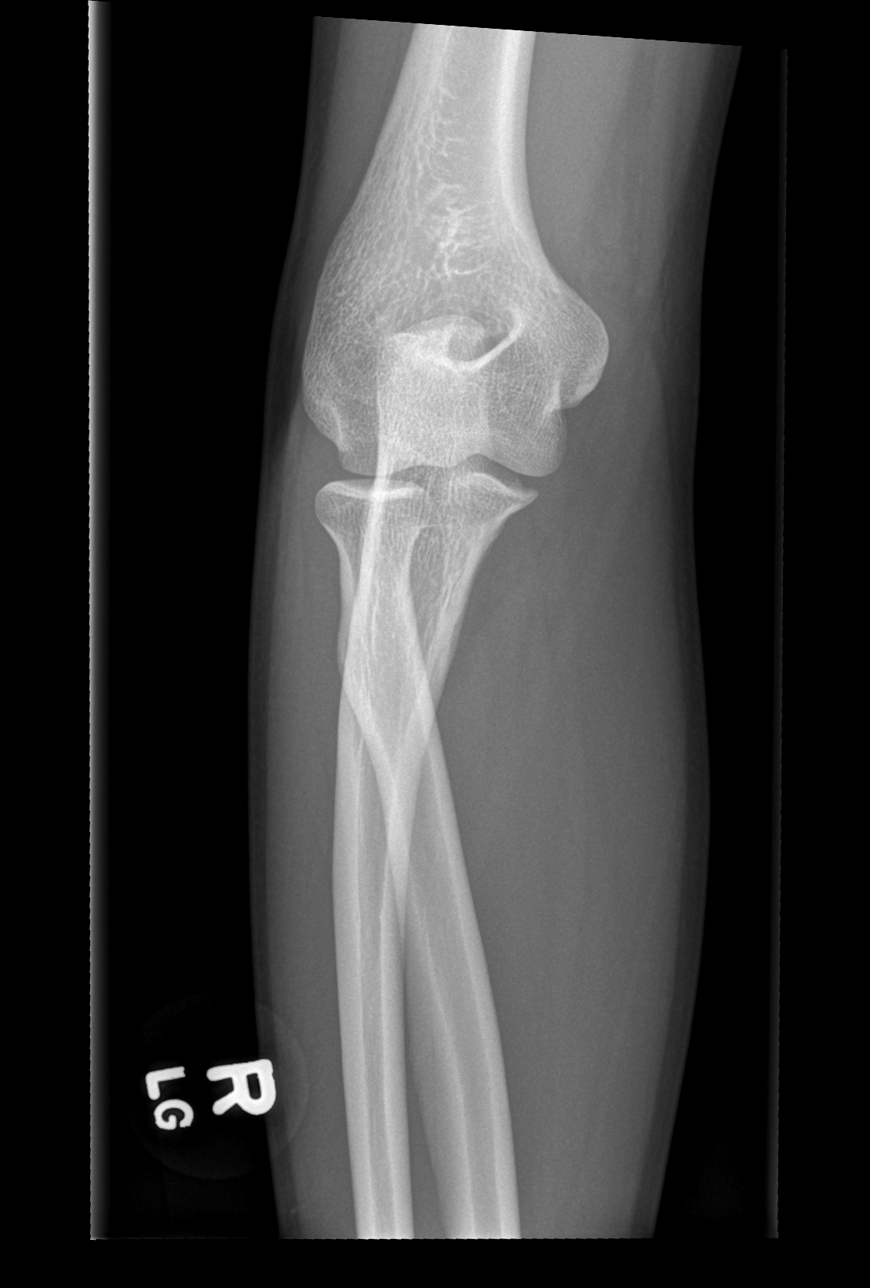

[x elbow lat right]
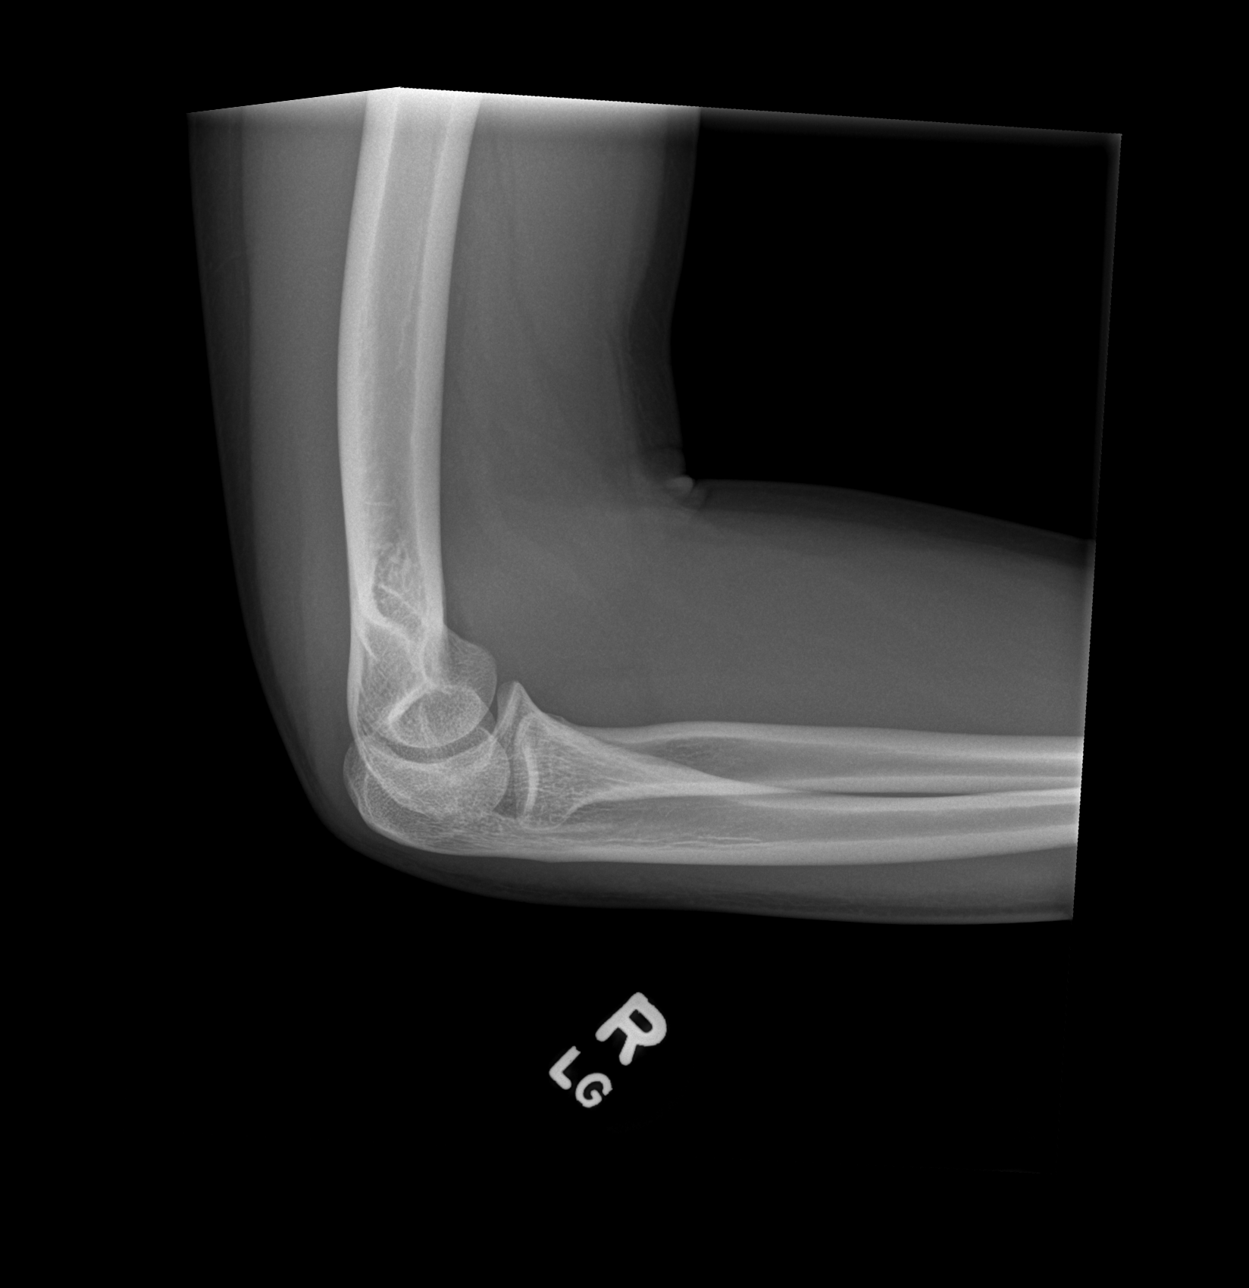

[4 of 4 positions shown; findings below may reference images not displayed]

FINDINGS: There is no evidence of fracture, dislocation, or joint effusion.
There is no evidence of arthropathy or other focal bone abnormality.
Soft tissues are unremarkable.
IMPRESSION: Negative.

## 2021-04-14 DIAGNOSIS — R070 Pain in throat: Secondary | ICD-10-CM | POA: Diagnosis not present

## 2021-04-14 DIAGNOSIS — J029 Acute pharyngitis, unspecified: Secondary | ICD-10-CM | POA: Diagnosis not present

## 2021-04-14 DIAGNOSIS — J028 Acute pharyngitis due to other specified organisms: Secondary | ICD-10-CM | POA: Diagnosis not present

## 2021-05-05 ENCOUNTER — Other Ambulatory Visit: Payer: Self-pay | Admitting: *Deleted

## 2021-05-05 MED ORDER — NORGESTIMATE-ETH ESTRADIOL 0.25-35 MG-MCG PO TABS: 1.0000 | ORAL_TABLET | Freq: Every day | ORAL | 10 refills | Status: DC

## 2021-06-29 ENCOUNTER — Other Ambulatory Visit: Payer: Self-pay

## 2021-06-29 ENCOUNTER — Encounter: Payer: Self-pay | Admitting: Obstetrics and Gynecology

## 2021-06-29 ENCOUNTER — Other Ambulatory Visit (HOSPITAL_COMMUNITY)
Admission: RE | Admit: 2021-06-29 | Discharge: 2021-06-29 | Disposition: A | Discharge status: Home / Self Care | Payer: BC Managed Care – PPO | Source: Ambulatory Visit | Attending: Obstetrics and Gynecology | Admitting: Obstetrics and Gynecology

## 2021-06-29 ENCOUNTER — Ambulatory Visit (INDEPENDENT_AMBULATORY_CARE_PROVIDER_SITE_OTHER): Payer: BC Managed Care – PPO | Admitting: Obstetrics and Gynecology

## 2021-06-29 VITALS — Ht 67.0 in | Wt 127.0 lb

## 2021-06-29 DIAGNOSIS — N898 Other specified noninflammatory disorders of vagina: Secondary | ICD-10-CM

## 2021-06-29 NOTE — Progress Notes (Signed)
    GYNECOLOGY ENCOUNTER NOTE  History:     Lori Simpson is a 20 y.o. G0P0000 female here for Concerns about vaginal odor x 1 week. 2 partner in the last 1 month. Not taking BC, stopped taking BC because of how it made her feel. The odor is not fishy, just does not smell like her normal smell. She reports off and spotting since 06/09/2021 which was shortly after she stopped BC pills.  No pain, no itching.   Gynecologic History Patient's last menstrual period was 06/09/2021. Contraception: none  Obstetric History OB History  Gravida Para Term Preterm AB Living  0 0 0 0 0 0  SAB IAB Ectopic Multiple Live Births  0 0 0 0 0    Past Medical History:  Diagnosis Date   Frequent headaches     History reviewed. No pertinent surgical history.  Current Outpatient Medications on File Prior to Visit  Medication Sig Dispense Refill   norgestimate-ethinyl estradiol (ORTHO-CYCLEN) 0.25-35 MG-MCG tablet Take 1 tablet by mouth daily. (Patient not taking: Reported on 06/29/2021) 28 tablet 10   No current facility-administered medications on file prior to visit.    Allergies  Allergen Reactions   Other Itching, Swelling and Other (See Comments)    Pineapple causes swelling and itchy mouth Seasonal allergies    Social History:  reports that she has been smoking cigarettes. She has never used smokeless tobacco. She reports current alcohol use. She reports current drug use. Drug: Marijuana.  Family History  Problem Relation Age of Onset   Anxiety disorder Mother    ADD / ADHD Mother    ADD / ADHD Brother    Migraines Paternal Aunt    Bipolar disorder Maternal Grandmother    Depression Maternal Grandmother    Migraines Paternal Grandfather    Epilepsy Other        [facility]    The following portions of the patient's history were reviewed and updated as appropriate: allergies, current medications, past family history, past medical history, past social history, past surgical history and  problem list.  Review of Systems Pertinent items noted in HPI and remainder of comprehensive ROS otherwise negative.  Physical Exam:  Ht 5\' 7"  (1.702 m)   Wt 127 lb (57.6 kg)   LMP 06/09/2021   BMI 19.89 kg/m  CONSTITUTIONAL: Well-developed, well-nourished female in no acute distress.  HENT:  Normocephalic SKIN: Skin is warm and dry.  MUSCULOSKELETAL: Normal range of motion.  ABDOMEN: Soft, no distention noted.  No tenderness, rebound or guarding.  PELVIC: Normal appearing external genitalia and urethral meatus.  Performed in the presence of a chaperone.   Assessment and Plan:   1. Foul smelling vaginal discharge  - Discussed progesterone only options for [facility] as patient does not like how estrogen/progesterone makes her feel. She would like to think about it and contact me or the office.  - Cervicovaginal ancillary only( [place])  Shawntelle Ungar, [facility] NP Faculty Practice Center for Harolyn Rutherford, [facility]

## 2021-07-02 ENCOUNTER — Telehealth: Payer: Self-pay

## 2021-07-02 DIAGNOSIS — N760 Acute vaginitis: Secondary | ICD-10-CM

## 2021-07-02 LAB — CERVICOVAGINAL ANCILLARY ONLY
Bacterial Vaginitis (gardnerella): POSITIVE — AB
Candida Glabrata: NEGATIVE
Candida Vaginitis: NEGATIVE
Chlamydia: NEGATIVE
Comment: NEGATIVE
Comment: NEGATIVE
Comment: NEGATIVE
Comment: NEGATIVE
Comment: NEGATIVE
Comment: NORMAL
Neisseria Gonorrhea: NEGATIVE
Trichomonas: NEGATIVE

## 2021-07-02 MED ORDER — METRONIDAZOLE 500 MG PO TABS: 500.0000 mg | ORAL_TABLET | Freq: Two times a day (BID) | ORAL | 0 refills | Status: DC

## 2021-07-02 NOTE — Telephone Encounter (Signed)
Pt called after hours line requesting meds for positive BV results. Flagyl sent per protocol.

## 2021-07-09 DIAGNOSIS — L738 Other specified follicular disorders: Secondary | ICD-10-CM | POA: Diagnosis not present

## 2021-07-09 DIAGNOSIS — N390 Urinary tract infection, site not specified: Secondary | ICD-10-CM | POA: Diagnosis not present

## 2021-08-25 ENCOUNTER — Ambulatory Visit (INDEPENDENT_AMBULATORY_CARE_PROVIDER_SITE_OTHER): Payer: BC Managed Care – PPO | Admitting: Obstetrics and Gynecology

## 2021-08-25 ENCOUNTER — Other Ambulatory Visit (HOSPITAL_COMMUNITY)
Admission: RE | Admit: 2021-08-25 | Discharge: 2021-08-25 | Disposition: A | Discharge status: Home / Self Care | Payer: BC Managed Care – PPO | Source: Ambulatory Visit | Attending: Obstetrics and Gynecology | Admitting: Obstetrics and Gynecology

## 2021-08-25 ENCOUNTER — Encounter: Payer: Self-pay | Admitting: Obstetrics and Gynecology

## 2021-08-25 ENCOUNTER — Other Ambulatory Visit: Payer: Self-pay

## 2021-08-25 VITALS — BP 108/74 | HR 80 | Ht 67.0 in | Wt 123.0 lb

## 2021-08-25 DIAGNOSIS — Z113 Encounter for screening for infections with a predominantly sexual mode of transmission: Secondary | ICD-10-CM

## 2021-08-25 DIAGNOSIS — Z3202 Encounter for pregnancy test, result negative: Secondary | ICD-10-CM | POA: Diagnosis not present

## 2021-08-25 DIAGNOSIS — N898 Other specified noninflammatory disorders of vagina: Secondary | ICD-10-CM

## 2021-08-25 LAB — POCT URINALYSIS DIPSTICK
Bilirubin, UA: NEGATIVE
Blood, UA: NEGATIVE
Glucose, UA: NEGATIVE
Ketones, UA: NEGATIVE
Leukocytes, UA: NEGATIVE
Nitrite, UA: NEGATIVE
Protein, UA: NEGATIVE
Spec Grav, UA: 1.01 (ref 1.010–1.025)
Urobilinogen, UA: 0.2 E.U./dL
pH, UA: 5 (ref 5.0–8.0)

## 2021-08-25 LAB — POCT URINE PREGNANCY: Preg Test, Ur: NEGATIVE
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
## 2021-08-26 LAB — CERVICOVAGINAL ANCILLARY ONLY
Bacterial Vaginitis (gardnerella): POSITIVE — AB
Candida Glabrata: NEGATIVE
Candida Vaginitis: NEGATIVE
Chlamydia: NEGATIVE
Comment: NEGATIVE
Comment: NEGATIVE
Comment: NEGATIVE
Comment: NEGATIVE
Comment: NEGATIVE
Comment: NORMAL
Neisseria Gonorrhea: NEGATIVE
Trichomonas: NEGATIVE

## 2021-08-27 ENCOUNTER — Other Ambulatory Visit: Payer: Self-pay | Admitting: Obstetrics and Gynecology

## 2021-08-27 MED ORDER — METRONIDAZOLE 500 MG PO TABS: 500.0000 mg | ORAL_TABLET | Freq: Two times a day (BID) | ORAL | 0 refills | Status: AC

## 2021-12-07 ENCOUNTER — Ambulatory Visit (INDEPENDENT_AMBULATORY_CARE_PROVIDER_SITE_OTHER): Payer: BC Managed Care – PPO | Admitting: Advanced Practice Midwife

## 2021-12-07 ENCOUNTER — Encounter: Payer: Self-pay | Admitting: Advanced Practice Midwife

## 2021-12-07 ENCOUNTER — Other Ambulatory Visit (HOSPITAL_COMMUNITY)
Admission: RE | Admit: 2021-12-07 | Discharge: 2021-12-07 | Disposition: A | Discharge status: Home / Self Care | Payer: BC Managed Care – PPO | Source: Ambulatory Visit | Attending: Advanced Practice Midwife | Admitting: Advanced Practice Midwife

## 2021-12-07 ENCOUNTER — Other Ambulatory Visit: Payer: Self-pay

## 2021-12-07 VITALS — BP 106/72 | HR 64 | Ht 67.0 in | Wt 130.0 lb

## 2021-12-07 DIAGNOSIS — N898 Other specified noninflammatory disorders of vagina: Secondary | ICD-10-CM | POA: Insufficient documentation

## 2021-12-07 DIAGNOSIS — Z8742 Personal history of other diseases of the female genital tract: Secondary | ICD-10-CM | POA: Insufficient documentation

## 2021-12-07 DIAGNOSIS — B9689 Other specified bacterial agents as the cause of diseases classified elsewhere: Secondary | ICD-10-CM

## 2021-12-07 DIAGNOSIS — N760 Acute vaginitis: Secondary | ICD-10-CM | POA: Diagnosis not present

## 2021-12-07 DIAGNOSIS — Z113 Encounter for screening for infections with a predominantly sexual mode of transmission: Secondary | ICD-10-CM | POA: Diagnosis not present

## 2021-12-07 MED ORDER — BORIC ACID CRYS: 600.0000 mg | CRYSTALS | 5 refills | Status: DC

## 2021-12-07 NOTE — Progress Notes (Signed)
° °  GYNECOLOGY PROGRESS NOTE  History:  21 y.o. G0P0000 presents to [facility] office today for problem gyn visit. She reports vaginal discharge with odor, similar to previous episodes of BV. She desires STI screening. She denies h/a, dizziness, shortness of breath, n/v, or fever/chills.    The following portions of the patient's history were reviewed and updated as appropriate: allergies, current medications, past family history, past medical history, past social history, past surgical history and problem list. Last pap smear on 01/28/21 was normal.  Health Maintenance Due  Topic Date Due   COVID-19 Vaccine (1) Never done   Pneumococcal Vaccine 4-53 Years old (1 - PCV) Never done   HPV VACCINES (1 - 2-dose series) Never done   TETANUS/TDAP  Never done   INFLUENZA VACCINE  Never done     Review of Systems:  Pertinent items are noted in HPI.   Objective:  Physical Exam Blood pressure 106/72, pulse 64, height 5\' 7"  (1.702 m), weight 130 lb (59 kg), last menstrual period 11/07/2021. VS reviewed, nursing note reviewed,  Constitutional: well developed, well nourished, no distress HEENT: normocephalic CV: normal rate Pulm/chest wall: normal effort Breast Exam: deferred Abdomen: soft Neuro: alert and oriented x 3 Skin: warm, dry Psych: affect normal Pelvic exam: Pt self swabbed for aptima test.  Assessment & Plan:  1. Routine screening for STI (sexually transmitted infection)  - Cervicovaginal ancillary only( [place]) - HIV antibody (with reflex) - Hepatitis C Antibody - Hepatitis B Surface AntiGEN - RPR  2. Vaginal discharge  - Cervicovaginal ancillary only( [place])  3. Bacterial vaginosis --Recurrent BV --Pt given verbal prevention teaching and sent via MyChart  - Boric Acid CRYS; Place 600 mg vaginally 2 (two) times a week.  Dispense: 500 g; Refill: 5   14/03/2021, CNM 2:30 PM

## 2021-12-09 LAB — CERVICOVAGINAL ANCILLARY ONLY
Bacterial Vaginitis (gardnerella): NEGATIVE
Candida Glabrata: NEGATIVE
Candida Vaginitis: NEGATIVE
Chlamydia: NEGATIVE
Comment: NEGATIVE
Comment: NEGATIVE
Comment: NEGATIVE
Comment: NEGATIVE
Comment: NEGATIVE
Comment: NORMAL
Neisseria Gonorrhea: NEGATIVE
Trichomonas: NEGATIVE

## 2021-12-12 ENCOUNTER — Encounter: Payer: Self-pay | Admitting: Advanced Practice Midwife

## 2022-02-15 ENCOUNTER — Encounter: Payer: Self-pay | Admitting: Obstetrics and Gynecology

## 2022-02-15 ENCOUNTER — Ambulatory Visit (INDEPENDENT_AMBULATORY_CARE_PROVIDER_SITE_OTHER): Payer: BC Managed Care – PPO | Admitting: Obstetrics and Gynecology

## 2022-02-15 ENCOUNTER — Other Ambulatory Visit: Payer: Self-pay

## 2022-02-15 VITALS — BP 96/64 | HR 79 | Resp 16 | Ht 67.0 in | Wt 124.0 lb

## 2022-02-15 DIAGNOSIS — N926 Irregular menstruation, unspecified: Secondary | ICD-10-CM

## 2022-02-15 DIAGNOSIS — N9089 Other specified noninflammatory disorders of vulva and perineum: Secondary | ICD-10-CM | POA: Diagnosis not present

## 2022-02-15 LAB — POCT URINE PREGNANCY: Preg Test, Ur: NEGATIVE
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
## 2022-08-15 DIAGNOSIS — Z5321 Procedure and treatment not carried out due to patient leaving prior to being seen by health care provider: Secondary | ICD-10-CM | POA: Diagnosis not present

## 2022-08-15 DIAGNOSIS — R1030 Lower abdominal pain, unspecified: Secondary | ICD-10-CM | POA: Insufficient documentation

## 2022-08-15 DIAGNOSIS — R110 Nausea: Secondary | ICD-10-CM | POA: Diagnosis not present

## 2022-08-16 ENCOUNTER — Emergency Department (HOSPITAL_COMMUNITY)
Admission: EM | Admit: 2022-08-16 | Discharge: 2022-08-16 | Disposition: A | Discharge status: Home / Self Care | Payer: BC Managed Care – PPO | Attending: Emergency Medicine | Admitting: Emergency Medicine

## 2022-08-16 ENCOUNTER — Other Ambulatory Visit: Payer: Self-pay

## 2022-08-16 ENCOUNTER — Encounter (HOSPITAL_COMMUNITY): Payer: Self-pay

## 2022-08-16 LAB — URINALYSIS, ROUTINE W REFLEX MICROSCOPIC
Bilirubin Urine: NEGATIVE
Glucose, UA: NEGATIVE mg/dL
Hgb urine dipstick: NEGATIVE
Ketones, ur: NEGATIVE mg/dL
Leukocytes,Ua: NEGATIVE
Nitrite: NEGATIVE
Protein, ur: NEGATIVE mg/dL
Specific Gravity, Urine: 1.014 (ref 1.005–1.030)
pH: 8 (ref 5.0–8.0)

## 2022-08-16 LAB — PREGNANCY, URINE: Preg Test, Ur: NEGATIVE

## 2022-08-16 NOTE — ED Triage Notes (Signed)
Lower abd cramping x1 hrs, worse laying on side with nausea. Pt reports LMP 07/07/22.  Denies vaginal bleeding.

## 2022-08-16 NOTE — ED Notes (Signed)
Urine sent to main lab.

## 2022-10-25 ENCOUNTER — Ambulatory Visit
Admission: RE | Admit: 2022-10-25 | Discharge: 2022-10-25 | Disposition: A | Discharge status: Home / Self Care | Payer: BC Managed Care – PPO | Source: Ambulatory Visit | Attending: Physician Assistant | Admitting: Physician Assistant

## 2022-10-25 ENCOUNTER — Other Ambulatory Visit: Payer: Self-pay | Admitting: Obstetrics and Gynecology

## 2022-10-25 VITALS — BP 122/70 | HR 62 | Temp 98.6°F | Resp 18 | Ht 67.0 in | Wt 125.0 lb

## 2022-10-25 DIAGNOSIS — Z113 Encounter for screening for infections with a predominantly sexual mode of transmission: Secondary | ICD-10-CM | POA: Insufficient documentation

## 2022-10-25 DIAGNOSIS — N898 Other specified noninflammatory disorders of vagina: Secondary | ICD-10-CM | POA: Insufficient documentation

## 2022-10-25 LAB — POCT URINALYSIS DIP (MANUAL ENTRY)
Bilirubin, UA: NEGATIVE
Blood, UA: NEGATIVE
Glucose, UA: NEGATIVE mg/dL
Ketones, POC UA: NEGATIVE mg/dL
Leukocytes, UA: NEGATIVE
Nitrite, UA: NEGATIVE
Protein Ur, POC: NEGATIVE mg/dL
Spec Grav, UA: 1.02 (ref 1.010–1.025)
Urobilinogen, UA: 0.2 E.U./dL
pH, UA: 8 (ref 5.0–8.0)

## 2022-10-25 LAB — POCT URINE PREGNANCY: Preg Test, Ur: NEGATIVE

## 2022-10-25 MED ORDER — METRONIDAZOLE 500 MG PO TABS: 500.0000 mg | ORAL_TABLET | Freq: Two times a day (BID) | ORAL | 0 refills | Status: DC

## 2022-10-25 NOTE — Discharge Instructions (Addendum)
Start metronidazole to cover for bacterial vaginosis.  Do not drink any alcohol while on this medication for several days after completing course as it would cause you to vomit.  Use backup birth control while on antibiotics such as a condom.  Wear loosefitting cotton underwear and use hypotonic soaps and detergents.  Monitor MyChart for your results.  We will contact you if we need to arrange additional treatment.  If you develop any worsening symptoms please return for reevaluation.

## 2022-10-25 NOTE — ED Provider Notes (Signed)
[facility]    CSN: [id number] Arrival date & time: 10/25/22  1145      History   Chief Complaint Chief Complaint  Patient presents with   Vaginal Discharge    Entered by patient    HPI Lori Simpson is a 21 y.o. female.   Patient presents today with a several day history of vaginal discharge.  She describes this as copious and malodorous.  Denies any pelvic pain, vaginal irritation, abdominal pain, nausea, vomiting, diarrhea, urinary symptoms.  She does have a history of bacterial vaginosis and states current symptoms are similar to previous episodes of this condition.  Denies any recent antibiotics or medication changes.  Denies changes to personal hygiene products including soaps or detergents.  She is interested in complete STI panel though she does not consistently use condoms and has low suspicion for STI.  She is requesting treatment if appropriate for bacterial vaginosis today.    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 No pertinent surgical history.  OB History     Gravida  0   Para  0   Term  0   Preterm  0   AB  0   Living  0      SAB  0   IAB  0   Ectopic  0   Multiple  0   Live Births  0            Home Medications    Prior to Admission medications   Medication Sig Start Date End Date Taking? Authorizing Provider  metroNIDAZOLE (FLAGYL) 500 MG tablet Take 1 tablet (500 mg total) by mouth 2 (two) times daily. 10/25/22  Yes Marsha Gundlach,  Noberto Retort, PA-C  norgestimate-ethinyl estradiol (ORTHO-CYCLEN) 0.25-35 MG-MCG tablet Take 1 tablet by mouth daily. 05/05/21  Yes Constant, Peggy, MD  Boric Acid CRYS Place 600 mg vaginally 2 (two) times a week. Patient not taking: Reported on 02/15/2022 12/09/21   Leftwich-Kirby, Wilmer Floor, CNM    Family History Family History  Problem Relation Age of Onset   Anxiety disorder Mother    ADD / ADHD Mother    ADD / ADHD Brother    Migraines Paternal Aunt    Bipolar disorder Maternal Grandmother    Depression Maternal Grandmother    Migraines Paternal Grandfather    Epilepsy Other        MGU    Social History Social History   Tobacco Use   Smoking status: Every Day    Types: Cigarettes   Smokeless tobacco: Never  Vaping Use   Vaping Use: Never used  Substance Use Topics   Alcohol use: Yes    Alcohol/week: 0.0 standard drinks of alcohol   Drug use: Yes    Types: Marijuana     Allergies   Other   Review of Systems Review of Systems  Constitutional:  Negative for activity change, appetite change, fatigue and fever.  Gastrointestinal:  Negative for abdominal pain, diarrhea, nausea and vomiting.  Genitourinary:  Positive for vaginal discharge. Negative for dysuria, frequency, genital sores, pelvic pain, urgency, vaginal bleeding and vaginal pain.     Physical Exam Triage Vital Signs ED Triage Vitals  Enc Vitals Group     BP 10/25/22 1208 122/70     Pulse Rate 10/25/22 1208 62     Resp 10/25/22 1208 18     Temp 10/25/22 1208 98.6 F (37 C)     Temp Source 10/25/22 1208 Oral     SpO2 10/25/22 1208 97 %     Weight 10/25/22 1210 125 lb (56.7 kg)     Height 10/25/22 1210 5\' 7"  (1.702 m)     Head Circumference --      Peak Flow --      Pain Score 10/25/22 1209 0     Pain Loc --      Pain Edu? --      Excl. in GC? --    No data found.  Updated Vital Signs BP 122/70 (BP Location: Right Arm)   Pulse 62   Temp 98.6 F (37 C) (Oral)   Resp 18   Ht 5\' 7"  (1.702 m)   Wt  125 lb (56.7 kg)   LMP 09/23/2022   SpO2 97%   BMI 19.58 kg/m   Visual Acuity Right Eye Distance:   Left Eye Distance:   Bilateral Distance:    Right Eye Near:   Left Eye Near:    Bilateral Near:     Physical Exam Vitals reviewed.  Constitutional:      General: She is awake. She is not in acute distress.    Appearance: Normal appearance. She is well-developed. She is not ill-appearing.     Comments: Very pleasant female appears stated age in no acute distress sitting comfortably in exam room  HENT:     Head: Normocephalic and atraumatic.  Cardiovascular:     Rate and Rhythm: Normal rate and regular rhythm.     Heart sounds: Normal heart sounds, S1 normal and S2 normal. No murmur heard. Pulmonary:     Effort: Pulmonary effort is normal.     Breath sounds: Normal breath sounds. No wheezing, rhonchi or rales.     Comments: Clear to auscultation bilaterally Abdominal:     General: Bowel sounds are normal.     Palpations: Abdomen is soft.     Tenderness: There is no abdominal tenderness. There is no right CVA tenderness, left CVA tenderness, guarding or rebound.     Comments: Benign abdominal exam  Genitourinary:    Comments: Exam deferred Psychiatric:        Behavior: Behavior is cooperative.      UC Treatments / Results  Labs (all labs ordered are listed, but only abnormal results are displayed) Labs Reviewed  HIV ANTIBODY (ROUTINE TESTING W REFLEX)  RPR  POCT URINE PREGNANCY  POCT URINALYSIS DIP (MANUAL ENTRY)  CERVICOVAGINAL ANCILLARY ONLY    EKG   Radiology No results found.  Procedures Procedures (including critical care time)  Medications Ordered in UC Medications - No data to display  Initial Impression / Assessment and Plan / UC Course  I have reviewed the triage vital signs and the nursing notes.  Pertinent labs & imaging results that were available during my care of the patient were reviewed by me and considered in my medical decision making  (see chart for details).     Patient is well-appearing, afebrile, nontoxic, nontachycardic.  Urine was obtained to ensure UTI not contributing to symptoms  and was normal.  Urine pregnancy was negative.  STI swab was collected and pending.  HIV/syphilis testing was obtained and is pending as well.  Will empirically treat with metronidazole given clinical presentation.  Discussed that she should not drink any alcohol while on this medication due to associated Antabuse side effects.  She is to use hypoallergenic soaps detergents or loosefitting cotton underwear.  Discussed that she develops any additional symptoms she should return for reevaluation.  Strict return precautions given.  Final Clinical Impressions(s) / UC Diagnoses   Final diagnoses:  Vaginal discharge  Routine screening for STI (sexually transmitted infection)  Vaginal odor     Discharge Instructions      Start metronidazole to cover for bacterial vaginosis.  Do not drink any alcohol while on this medication for several days after completing course as it would cause you to vomit.  Wear loosefitting cotton underwear and use hypotonic soaps and detergents.  Monitor MyChart for your results.  We will contact you if we need to arrange additional treatment.  If you develop any worsening symptoms please return for reevaluation.     ED Prescriptions     Medication Sig Dispense Auth. Provider   metroNIDAZOLE (FLAGYL) 500 MG tablet Take 1 tablet (500 mg total) by mouth 2 (two) times daily. 14 tablet Sahasra Belue, Noberto Retort, PA-C      PDMP not reviewed this encounter.   Jeani Hawking, PA-C 10/25/22 1229

## 2022-10-25 NOTE — ED Triage Notes (Signed)
Patient c/o white thick vaginal discharge w/odor x 1 day.  Used condoms w/partner.  Patient requesting STI testing including bloodwork.  No vaginal itching.

## 2022-10-26 LAB — CERVICOVAGINAL ANCILLARY ONLY
Bacterial Vaginitis (gardnerella): POSITIVE — AB
Candida Glabrata: NEGATIVE
Candida Vaginitis: NEGATIVE
Chlamydia: NEGATIVE
Comment: NEGATIVE
Comment: NEGATIVE
Comment: NEGATIVE
Comment: NEGATIVE
Comment: NEGATIVE
Comment: NORMAL
Neisseria Gonorrhea: NEGATIVE
Trichomonas: NEGATIVE

## 2022-10-26 LAB — HIV ANTIBODY (ROUTINE TESTING W REFLEX): HIV 1&2 Ab, 4th Generation: NONREACTIVE

## 2022-10-26 LAB — RPR: RPR Ser Ql: NONREACTIVE

## 2022-11-01 ENCOUNTER — Ambulatory Visit: Payer: BC Managed Care – PPO | Admitting: Obstetrics and Gynecology

## 2024-01-23 NOTE — Progress Notes (Unsigned)
   ANNUAL EXAM Patient name: Lori Simpson MRN [id number]  Date of birth: Jul 22, 2001 Chief Complaint:   No chief complaint on file.  History of Present Illness:   Lori Simpson is a 23 y.o. G0P0000 with No LMP recorded. being seen today for a routine annual exam.  Current complaints: ***   The pregnancy intention screening data noted above was reviewed. Potential methods of contraception were discussed. The patient elected to proceed with No data recorded.   Last pap 01/28/21. Results were:  NILM . H/O abnormal pap: no Last mammogram: n/a. Results were: N/A. Family h/o breast cancer: {yes***/no:23838} Last colonoscopy: n/a. Results were: N/A. Family h/o colorectal cancer: {yes***/no:23838} HPV vaccine: ***      No data to display               No data to display           Review of Systems:   Pertinent items are noted in HPI Denies any headaches, blurred vision, fatigue, shortness of breath, chest pain, abdominal pain, abnormal vaginal discharge/itching/odor/irritation, problems with periods, bowel movements, urination, or intercourse unless otherwise stated above. Pertinent History Reviewed:  Reviewed past medical,surgical, social and family history.  Reviewed problem list, medications and allergies. Physical Assessment:  There were no vitals filed for this visit.There is no height or weight on file to calculate BMI.        Physical Examination:   General appearance - well appearing, and in no distress  Mental status - alert, oriented to person, place, and time  Chest - respiratory effort normal  Heart - normal peripheral perfusion  Breasts - breasts appear normal, no suspicious masses, no skin or nipple changes or axillary nodes  Abdomen - soft, nontender, nondistended, no masses or organomegaly  Pelvic - VULVA: normal appearing vulva with no masses, tenderness or lesions  VAGINA: normal appearing vagina with normal color and discharge, no lesions  CERVIX:  normal appearing cervix without discharge or lesions, no CMT  Thin prep pap is done with reflex HR HPV cotesting  UTERUS: uterus is felt to be normal size, shape, consistency and nontender   ADNEXA: No adnexal masses or tenderness noted.  Chaperone present for exam  No results found for this or any previous visit (from the past 24 hours).  Assessment & Plan:  1) Well-Woman Exam Mammogram: @ 25yo, or sooner if problems Colonoscopy: @ 25yo, or sooner if problems Pap: Collected today Gardasil: Recommended*** GC/CT: Recommended*** HIV/HCV/RPR: Offered, last 2023 NR Flu/tdap***  2) Vaginal discharge ***  Labs/procedures today: ***  No orders of the defined types were placed in this encounter.   Meds: No orders of the defined types were placed in this encounter.   Follow-up: No follow-ups on file.  Lennart Pall, MD 01/23/2024 2:02 PM

## 2024-01-24 ENCOUNTER — Encounter: Payer: Self-pay | Admitting: Obstetrics and Gynecology

## 2024-01-24 ENCOUNTER — Ambulatory Visit (INDEPENDENT_AMBULATORY_CARE_PROVIDER_SITE_OTHER): Payer: BC Managed Care – PPO | Admitting: Obstetrics and Gynecology

## 2024-01-24 ENCOUNTER — Other Ambulatory Visit (HOSPITAL_COMMUNITY)
Admission: RE | Admit: 2024-01-24 | Discharge: 2024-01-24 | Disposition: A | Discharge status: Home / Self Care | Payer: BC Managed Care – PPO | Source: Ambulatory Visit | Attending: Obstetrics and Gynecology | Admitting: Obstetrics and Gynecology

## 2024-01-24 VITALS — BP 115/68 | HR 95 | Ht 67.0 in | Wt 123.0 lb

## 2024-01-24 DIAGNOSIS — Z23 Encounter for immunization: Secondary | ICD-10-CM | POA: Diagnosis not present

## 2024-01-24 DIAGNOSIS — Z01419 Encounter for gynecological examination (general) (routine) without abnormal findings: Secondary | ICD-10-CM | POA: Insufficient documentation

## 2024-01-24 DIAGNOSIS — L739 Follicular disorder, unspecified: Secondary | ICD-10-CM

## 2024-01-24 DIAGNOSIS — Z113 Encounter for screening for infections with a predominantly sexual mode of transmission: Secondary | ICD-10-CM | POA: Insufficient documentation

## 2024-01-24 DIAGNOSIS — N898 Other specified noninflammatory disorders of vagina: Secondary | ICD-10-CM | POA: Diagnosis not present

## 2024-01-24 NOTE — Patient Instructions (Addendum)
 It was nice meeting you today! You will see your results in the MyChart app within 1 week  Place a warm washcloth over the area for 20 minutes at a time to help it drain and heal. Avoid picking/popping  Avoid: - Synthetic underwear - Tight pants - Swim suits, thongs, leotards, leggings for prolonged periods of time - Scented soap/shampoo - Bubble baths - Scented detergents, dryer sheets - Baby wipes - Feminine sprays, douches, powders - Panty liners - Dyed toilet paper  Trying swapping out the above for: - Cotton or no underwear - Loose pants, skirts, dresses - Changing out of swimwear, thongs, and workout gear as soon as you're done exercising - Fragrance free soaps (like Dove sensitive skin) - Warm plain water baths - Unscented laundry detergent - Use a bedet or peri bottle to rinse instead of baby wipes - Tampons, cotton pads, cotton period underwear - Undyed toilet paper

## 2024-01-25 LAB — CERVICOVAGINAL ANCILLARY ONLY
Bacterial Vaginitis (gardnerella): POSITIVE — AB
Candida Glabrata: NEGATIVE
Candida Vaginitis: NEGATIVE
Chlamydia: NEGATIVE
Comment: NEGATIVE
Comment: NEGATIVE
Comment: NEGATIVE
Comment: NEGATIVE
Comment: NEGATIVE
Comment: NORMAL
Neisseria Gonorrhea: NEGATIVE
Trichomonas: NEGATIVE

## 2024-01-25 LAB — RPR: RPR Ser Ql: NONREACTIVE

## 2024-01-25 LAB — HIV ANTIBODY (ROUTINE TESTING W REFLEX): HIV Screen 4th Generation wRfx: NONREACTIVE

## 2024-01-25 LAB — HEPATITIS B SURFACE ANTIGEN: Hepatitis B Surface Ag: NEGATIVE

## 2024-01-25 LAB — HEPATITIS C ANTIBODY: Hep C Virus Ab: NONREACTIVE

## 2024-01-26 LAB — CYTOLOGY - PAP: Diagnosis: NEGATIVE

## 2024-01-29 ENCOUNTER — Encounter: Payer: Self-pay | Admitting: Obstetrics and Gynecology

## 2024-01-29 MED ORDER — METRONIDAZOLE 0.75 % VA GEL: 1.0000 | Freq: Every day | VAGINAL | 0 refills | Status: DC

## 2024-01-29 NOTE — Addendum Note (Signed)
 Addended by: Harvie Bridge on: 01/29/2024 01:09 PM   Modules accepted: Orders

## 2024-02-12 MED ORDER — METRONIDAZOLE 0.75 % VA GEL: 1.0000 | Freq: Every day | VAGINAL | 0 refills | Status: DC

## 2024-02-12 MED ORDER — METRONIDAZOLE 0.75 % VA GEL: 1.0000 | VAGINAL | 6 refills | Status: DC

## 2024-03-14 ENCOUNTER — Other Ambulatory Visit: Payer: Self-pay | Admitting: Obstetrics and Gynecology

## 2024-03-14 MED ORDER — METRONIDAZOLE 0.75 % VA GEL: 1.0000 | Freq: Every day | VAGINAL | 0 refills | Status: DC

## 2024-04-09 ENCOUNTER — Ambulatory Visit: Admitting: Obstetrics and Gynecology

## 2024-04-30 DIAGNOSIS — H9201 Otalgia, right ear: Secondary | ICD-10-CM | POA: Diagnosis not present

## 2024-04-30 DIAGNOSIS — R350 Frequency of micturition: Secondary | ICD-10-CM | POA: Diagnosis not present

## 2024-04-30 DIAGNOSIS — N949 Unspecified condition associated with female genital organs and menstrual cycle: Secondary | ICD-10-CM | POA: Diagnosis not present

## 2024-04-30 DIAGNOSIS — N390 Urinary tract infection, site not specified: Secondary | ICD-10-CM | POA: Diagnosis not present

## 2024-04-30 DIAGNOSIS — A499 Bacterial infection, unspecified: Secondary | ICD-10-CM | POA: Diagnosis not present

## 2024-04-30 DIAGNOSIS — J302 Other seasonal allergic rhinitis: Secondary | ICD-10-CM | POA: Diagnosis not present

## 2024-05-06 ENCOUNTER — Encounter: Payer: Self-pay | Admitting: Obstetrics & Gynecology

## 2024-05-06 ENCOUNTER — Other Ambulatory Visit (HOSPITAL_COMMUNITY)
Admission: RE | Admit: 2024-05-06 | Discharge: 2024-05-06 | Disposition: A | Discharge status: Home / Self Care | Source: Ambulatory Visit | Attending: Obstetrics & Gynecology | Admitting: Obstetrics & Gynecology

## 2024-05-06 ENCOUNTER — Ambulatory Visit (INDEPENDENT_AMBULATORY_CARE_PROVIDER_SITE_OTHER): Admitting: Obstetrics & Gynecology

## 2024-05-06 VITALS — BP 104/60 | HR 67 | Ht 67.0 in | Wt 124.0 lb

## 2024-05-06 DIAGNOSIS — N898 Other specified noninflammatory disorders of vagina: Secondary | ICD-10-CM | POA: Insufficient documentation

## 2024-05-06 DIAGNOSIS — Z113 Encounter for screening for infections with a predominantly sexual mode of transmission: Secondary | ICD-10-CM

## 2024-05-06 DIAGNOSIS — N9089 Other specified noninflammatory disorders of vulva and perineum: Secondary | ICD-10-CM | POA: Diagnosis not present
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
## 2024-05-07 ENCOUNTER — Other Ambulatory Visit: Payer: Self-pay | Admitting: Obstetrics & Gynecology

## 2024-05-07 ENCOUNTER — Ambulatory Visit: Payer: Self-pay | Admitting: Obstetrics & Gynecology

## 2024-05-07 LAB — CERVICOVAGINAL ANCILLARY ONLY
Bacterial Vaginitis (gardnerella): POSITIVE — AB
Candida Glabrata: NEGATIVE
Candida Vaginitis: POSITIVE — AB
Chlamydia: NEGATIVE
Comment: NEGATIVE
Comment: NEGATIVE
Comment: NEGATIVE
Comment: NEGATIVE
Comment: NEGATIVE
Comment: NORMAL
Neisseria Gonorrhea: NEGATIVE
Trichomonas: NEGATIVE

## 2024-05-07 LAB — HEPATITIS B SURFACE ANTIGEN: Hepatitis B Surface Ag: NEGATIVE

## 2024-05-07 LAB — RPR: RPR Ser Ql: NONREACTIVE

## 2024-05-07 LAB — HEPATITIS C ANTIBODY: Hep C Virus Ab: NONREACTIVE

## 2024-05-07 LAB — HIV ANTIBODY (ROUTINE TESTING W REFLEX): HIV Screen 4th Generation wRfx: NONREACTIVE

## 2024-05-07 MED ORDER — FLUCONAZOLE 150 MG PO TABS: ORAL_TABLET | ORAL | 0 refills | Status: AC

## 2024-05-07 MED ORDER — METRONIDAZOLE 500 MG PO TABS: 500.0000 mg | ORAL_TABLET | Freq: Two times a day (BID) | ORAL | 0 refills | Status: AC

## 2024-05-08 DIAGNOSIS — H60399 Other infective otitis externa, unspecified ear: Secondary | ICD-10-CM | POA: Diagnosis not present

## 2024-05-08 DIAGNOSIS — H9201 Otalgia, right ear: Secondary | ICD-10-CM | POA: Diagnosis not present

## 2024-05-08 LAB — HERPES SIMPLEX VIRUS CULTURE

## 2024-05-27 ENCOUNTER — Ambulatory Visit: Admitting: Obstetrics & Gynecology

## 2024-05-29 ENCOUNTER — Other Ambulatory Visit: Payer: Self-pay

## 2024-05-29 ENCOUNTER — Emergency Department (HOSPITAL_BASED_OUTPATIENT_CLINIC_OR_DEPARTMENT_OTHER)
Admission: EM | Admit: 2024-05-29 | Discharge: 2024-05-29 | Disposition: A | Discharge status: Home / Self Care | Attending: Emergency Medicine | Admitting: Emergency Medicine

## 2024-05-29 ENCOUNTER — Encounter (HOSPITAL_BASED_OUTPATIENT_CLINIC_OR_DEPARTMENT_OTHER): Payer: Self-pay

## 2024-05-29 ENCOUNTER — Emergency Department (HOSPITAL_BASED_OUTPATIENT_CLINIC_OR_DEPARTMENT_OTHER)

## 2024-05-29 DIAGNOSIS — M542 Cervicalgia: Secondary | ICD-10-CM | POA: Diagnosis not present

## 2024-05-29 DIAGNOSIS — M79662 Pain in left lower leg: Secondary | ICD-10-CM | POA: Insufficient documentation

## 2024-05-29 DIAGNOSIS — Z79899 Other long term (current) drug therapy: Secondary | ICD-10-CM | POA: Insufficient documentation

## 2024-05-29 DIAGNOSIS — R102 Pelvic and perineal pain: Secondary | ICD-10-CM | POA: Diagnosis not present

## 2024-05-29 DIAGNOSIS — S199XXA Unspecified injury of neck, initial encounter: Secondary | ICD-10-CM | POA: Diagnosis not present

## 2024-05-29 DIAGNOSIS — M79652 Pain in left thigh: Secondary | ICD-10-CM | POA: Diagnosis not present

## 2024-05-29 LAB — PREGNANCY, URINE: Preg Test, Ur: NEGATIVE

## 2024-05-29 MED ORDER — IOHEXOL 350 MG/ML SOLN
75.0000 mL | Freq: Once | INTRAVENOUS | Status: AC | PRN
  Administered 2024-05-29: 75 mL via INTRAVENOUS
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
## 2024-06-05 ENCOUNTER — Telehealth: Payer: Self-pay

## 2024-06-05 NOTE — Telephone Encounter (Signed)
 RN received mychart message from patient needing an appointment. RN spoke with patient and offered appointments, however, pt reported due to work schedule cannot come in until end of the month. Pt scheduled for 07/03/24 with provider. RN discussed Urgent Care as option as has later hours and is open on weekends.  Silvano LELON Piano, RN

## 2024-07-03 ENCOUNTER — Encounter: Payer: Self-pay | Admitting: Obstetrics and Gynecology

## 2024-07-03 ENCOUNTER — Other Ambulatory Visit (HOSPITAL_COMMUNITY)
Admission: RE | Admit: 2024-07-03 | Discharge: 2024-07-03 | Disposition: A | Discharge status: Home / Self Care | Source: Ambulatory Visit | Attending: Obstetrics and Gynecology | Admitting: Obstetrics and Gynecology

## 2024-07-03 ENCOUNTER — Ambulatory Visit: Admitting: Obstetrics and Gynecology

## 2024-07-03 VITALS — BP 103/67 | HR 52 | Ht 67.0 in | Wt 124.0 lb

## 2024-07-03 DIAGNOSIS — N898 Other specified noninflammatory disorders of vagina: Secondary | ICD-10-CM

## 2024-07-03 MED ORDER — BORIC ACID VAGINAL 600 MG VA SUPP: 1.0000 | Freq: Every evening | VAGINAL | 0 refills | Status: AC
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
## 2024-07-04 LAB — CERVICOVAGINAL ANCILLARY ONLY
Bacterial Vaginitis (gardnerella): NEGATIVE
Candida Glabrata: NEGATIVE
Candida Vaginitis: NEGATIVE
Comment: NEGATIVE
Comment: NEGATIVE
Comment: NEGATIVE

## 2024-07-05 ENCOUNTER — Ambulatory Visit: Payer: Self-pay | Admitting: Obstetrics and Gynecology

## 2024-08-16 ENCOUNTER — Ambulatory Visit (INDEPENDENT_AMBULATORY_CARE_PROVIDER_SITE_OTHER)

## 2024-08-16 VITALS — BP 118/76 | HR 77 | Ht 67.0 in | Wt 121.0 lb

## 2024-08-16 DIAGNOSIS — Z3202 Encounter for pregnancy test, result negative: Secondary | ICD-10-CM | POA: Diagnosis not present

## 2024-08-16 DIAGNOSIS — N926 Irregular menstruation, unspecified: Secondary | ICD-10-CM | POA: Diagnosis not present

## 2024-08-16 LAB — POCT URINE PREGNANCY: Preg Test, Ur: NEGATIVE
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
## 2024-08-17 LAB — BETA HCG QUANT (REF LAB): hCG Quant: <1 m[IU]/mL
